# Patient Record
Sex: Male | Born: 1960 | Race: White | Hispanic: Yes | Marital: Married | Smoking: Former smoker
Health system: Southern US, Community
[De-identification: ages and names within clinical notes are randomized; demographics above are authoritative.]

## PROBLEM LIST (undated history)

## (undated) DIAGNOSIS — F419 Anxiety disorder, unspecified: Secondary | ICD-10-CM

## (undated) DIAGNOSIS — Z8619 Personal history of other infectious and parasitic diseases: Secondary | ICD-10-CM

## (undated) DIAGNOSIS — F329 Major depressive disorder, single episode, unspecified: Secondary | ICD-10-CM

## (undated) DIAGNOSIS — F32A Depression, unspecified: Secondary | ICD-10-CM

## (undated) DIAGNOSIS — Z87442 Personal history of urinary calculi: Secondary | ICD-10-CM

## (undated) DIAGNOSIS — I10 Essential (primary) hypertension: Secondary | ICD-10-CM

## (undated) DIAGNOSIS — G4733 Obstructive sleep apnea (adult) (pediatric): Secondary | ICD-10-CM

## (undated) DIAGNOSIS — N200 Calculus of kidney: Secondary | ICD-10-CM

## (undated) HISTORY — PX: CYSTOSCOPY W/ URETERAL STENT PLACEMENT: SHX1429

---

## 2006-12-17 ENCOUNTER — Emergency Department (HOSPITAL_COMMUNITY): Admission: EM | Admit: 2006-12-17 | Discharge: 2006-12-17 | Payer: Self-pay | Admitting: Emergency Medicine

## 2007-08-25 ENCOUNTER — Emergency Department (HOSPITAL_COMMUNITY): Admission: EM | Admit: 2007-08-25 | Discharge: 2007-08-25 | Payer: Self-pay | Admitting: Emergency Medicine

## 2008-10-22 ENCOUNTER — Emergency Department (HOSPITAL_COMMUNITY): Admission: EM | Admit: 2008-10-22 | Discharge: 2008-10-23 | Payer: Self-pay | Admitting: Emergency Medicine

## 2010-02-16 ENCOUNTER — Ambulatory Visit (HOSPITAL_BASED_OUTPATIENT_CLINIC_OR_DEPARTMENT_OTHER): Admission: RE | Admit: 2010-02-16 | Discharge: 2010-02-16 | Payer: Self-pay | Admitting: Family Medicine

## 2010-02-23 ENCOUNTER — Ambulatory Visit: Payer: Self-pay | Admitting: Internal Medicine

## 2010-10-14 ENCOUNTER — Emergency Department (HOSPITAL_COMMUNITY): Payer: Self-pay

## 2010-10-14 ENCOUNTER — Encounter (HOSPITAL_COMMUNITY): Payer: Self-pay | Admitting: Radiology

## 2010-10-14 ENCOUNTER — Emergency Department (HOSPITAL_COMMUNITY)
Admission: EM | Admit: 2010-10-14 | Discharge: 2010-10-14 | Disposition: A | Discharge status: Home / Self Care | Payer: Self-pay | Attending: Emergency Medicine | Admitting: Emergency Medicine

## 2010-10-14 DIAGNOSIS — J020 Streptococcal pharyngitis: Secondary | ICD-10-CM | POA: Insufficient documentation

## 2010-10-14 DIAGNOSIS — E860 Dehydration: Secondary | ICD-10-CM | POA: Insufficient documentation

## 2010-10-14 HISTORY — DX: Essential (primary) hypertension: I10

## 2010-10-14 LAB — DIFFERENTIAL
Basophils Absolute: 0 10*3/uL (ref 0.0–0.1)
Eosinophils Relative: 1 % (ref 0–5)
Lymphocytes Relative: 20 % (ref 12–46)
Lymphs Abs: 1.7 10*3/uL (ref 0.7–4.0)
Monocytes Absolute: 0.5 10*3/uL (ref 0.1–1.0)
Monocytes Relative: 6 % (ref 3–12)
Neutro Abs: 6.2 10*3/uL (ref 1.7–7.7)

## 2010-10-14 LAB — CBC
HCT: 47.2 % (ref 39.0–52.0)
Hemoglobin: 17.4 g/dL — ABNORMAL HIGH (ref 13.0–17.0)
MCH: 31.9 pg (ref 26.0–34.0)
MCHC: 36.9 g/dL — ABNORMAL HIGH (ref 30.0–36.0)
MCV: 86.4 fL (ref 78.0–100.0)
RDW: 12.5 % (ref 11.5–15.5)

## 2010-10-14 LAB — BASIC METABOLIC PANEL
BUN: 12 mg/dL (ref 6–23)
Calcium: 9.1 mg/dL (ref 8.4–10.5)
Creatinine, Ser: 0.82 mg/dL (ref 0.4–1.5)
GFR calc non Af Amer: >60 mL/min (ref >60)
Glucose, Bld: 118 mg/dL — ABNORMAL HIGH (ref 70–99)

## 2010-10-14 MED ORDER — IOHEXOL 300 MG/ML  SOLN
100.0000 mL | Freq: Once | INTRAMUSCULAR | Status: AC | PRN
  Administered 2010-10-14: 100 mL via INTRAVENOUS

## 2011-12-15 ENCOUNTER — Encounter (HOSPITAL_COMMUNITY): Payer: Self-pay | Admitting: Emergency Medicine

## 2011-12-15 ENCOUNTER — Emergency Department (HOSPITAL_COMMUNITY)
Admission: EM | Admit: 2011-12-15 | Discharge: 2011-12-16 | Disposition: A | Discharge status: Home / Self Care | Payer: Medicaid Other | Attending: Emergency Medicine | Admitting: Emergency Medicine

## 2011-12-15 DIAGNOSIS — R110 Nausea: Secondary | ICD-10-CM | POA: Insufficient documentation

## 2011-12-15 DIAGNOSIS — N1330 Unspecified hydronephrosis: Secondary | ICD-10-CM | POA: Insufficient documentation

## 2011-12-15 DIAGNOSIS — R350 Frequency of micturition: Secondary | ICD-10-CM | POA: Insufficient documentation

## 2011-12-15 DIAGNOSIS — R109 Unspecified abdominal pain: Secondary | ICD-10-CM | POA: Insufficient documentation

## 2011-12-15 DIAGNOSIS — Z79899 Other long term (current) drug therapy: Secondary | ICD-10-CM | POA: Insufficient documentation

## 2011-12-15 DIAGNOSIS — I10 Essential (primary) hypertension: Secondary | ICD-10-CM | POA: Insufficient documentation

## 2011-12-15 DIAGNOSIS — R3911 Hesitancy of micturition: Secondary | ICD-10-CM | POA: Insufficient documentation

## 2011-12-15 DIAGNOSIS — N201 Calculus of ureter: Secondary | ICD-10-CM

## 2011-12-15 LAB — URINE MICROSCOPIC-ADD ON

## 2011-12-15 LAB — URINALYSIS, ROUTINE W REFLEX MICROSCOPIC
Glucose, UA: NEGATIVE mg/dL
Ketones, ur: NEGATIVE mg/dL
Leukocytes, UA: NEGATIVE
Protein, ur: NEGATIVE mg/dL
pH: 6.5 (ref 5.0–8.0)

## 2011-12-15 NOTE — ED Notes (Signed)
Pt states he is having pain in his lft flank area  Pt states he feels like he needs to urinate but not much comes out  Pt states had the same about 3 days ago

## 2011-12-16 ENCOUNTER — Emergency Department (HOSPITAL_COMMUNITY): Payer: Medicaid Other

## 2011-12-16 LAB — CBC
MCH: 30.5 pg (ref 26.0–34.0)
MCV: 85.6 fL (ref 78.0–100.0)
Platelets: 255 10*3/uL (ref 150–400)
RBC: 5.41 MIL/uL (ref 4.22–5.81)
RDW: 12.4 % (ref 11.5–15.5)
WBC: 12.4 10*3/uL — ABNORMAL HIGH (ref 4.0–10.5)

## 2011-12-16 LAB — BASIC METABOLIC PANEL
Calcium: 9.4 mg/dL (ref 8.4–10.5)
Creatinine, Ser: 1.31 mg/dL (ref 0.50–1.35)
GFR calc Af Amer: 71 mL/min — ABNORMAL LOW (ref >90)
Sodium: 137 mEq/L (ref 135–145)

## 2011-12-16 MED ORDER — HYDROMORPHONE HCL PF 1 MG/ML IJ SOLN
1.0000 mg | Freq: Once | INTRAMUSCULAR | Status: AC
  Administered 2011-12-16: 1 mg via INTRAVENOUS
  Filled 2011-12-16: qty 1

## 2011-12-16 MED ORDER — ONDANSETRON HCL 4 MG/2ML IJ SOLN
4.0000 mg | Freq: Once | INTRAMUSCULAR | Status: AC
  Administered 2011-12-16: 4 mg via INTRAVENOUS
  Filled 2011-12-16: qty 2

## 2011-12-16 MED ORDER — KETOROLAC TROMETHAMINE 30 MG/ML IJ SOLN
30.0000 mg | Freq: Once | INTRAMUSCULAR | Status: AC
  Administered 2011-12-16: 30 mg via INTRAVENOUS
  Filled 2011-12-16: qty 1

## 2011-12-16 MED ORDER — PROMETHAZINE HCL 25 MG PO TABS: 25.0000 mg | ORAL_TABLET | Freq: Four times a day (QID) | ORAL | Status: DC | PRN

## 2011-12-16 MED ORDER — SODIUM CHLORIDE 0.9 % IV SOLN
Freq: Once | INTRAVENOUS | Status: AC
  Administered 2011-12-16: 01:00:00 via INTRAVENOUS

## 2011-12-16 MED ORDER — IBUPROFEN 600 MG PO TABS: 600.0000 mg | ORAL_TABLET | Freq: Three times a day (TID) | ORAL | Status: AC | PRN

## 2011-12-16 MED ORDER — OXYCODONE-ACETAMINOPHEN 5-325 MG PO TABS: 1.0000 | ORAL_TABLET | ORAL | Status: AC | PRN

## 2011-12-16 MED ORDER — TAMSULOSIN HCL 0.4 MG PO CAPS: 0.4000 mg | ORAL_CAPSULE | Freq: Every day | ORAL | Status: DC

## 2011-12-16 NOTE — Discharge Instructions (Signed)
Ureteral Colic (Kidney Stones) Ureteral colic is the result of a condition when kidney stones form inside the kidney. Once kidney stones are formed they may move into the tube that connects the kidney with the bladder (ureter). If this occurs, this condition may cause pain (colic) in the ureter.  CAUSES  Pain is caused by stone movement in the ureter and the obstruction caused by the stone. SYMPTOMS  The pain comes and goes as the ureter contracts around the stone. The pain is usually intense, sharp, and stabbing in character. The location of the pain may move as the stone moves through the ureter. When the stone is near the kidney the pain is usually located in the back and radiates to the belly (abdomen). When the stone is ready to pass into the bladder the pain is often located in the lower abdomen on the side the stone is located. At this location, the symptoms may mimic those of a urinary tract infection with urinary frequency. Once the stone is located here it often passes into the bladder and the pain disappears completely. TREATMENT   Your caregiver will provide you with medicine for pain relief.   You may require specialized follow-up X-rays.   The absence of pain does not always mean that the stone has passed. It may have just stopped moving. If the urine remains completely obstructed, it can cause loss of kidney function or even complete destruction of the involved kidney. It is your responsibility and in your interest that X-rays and follow-ups as suggested by your caregiver are completed. Relief of pain without passage of the stone can be associated with severe damage to the kidney, including loss of kidney function on that side.   If your stone does not pass on its own, additional measures may be taken by your caregiver to ensure its removal.  HOME CARE INSTRUCTIONS   Increase your fluid intake. Water is the preferred fluid since juices containing vitamin C may acidify the urine making  it less likely for certain stones (uric acid stones) to pass.   Strain all urine. A strainer will be provided. Keep all particulate matter or stones for your caregiver to inspect.   Take your pain medicine as directed.   Make a follow-up appointment with your caregiver as directed.   Remember that the goal is passage of your stone. The absence of pain does not mean the stone is gone. Follow your caregiver's instructions.   Only take over-the-counter or prescription medicines for pain, discomfort, or fever as directed by your caregiver.  SEEK MEDICAL CARE IF:   Pain cannot be controlled with the prescribed medicine.   You have a fever.   Pain continues for longer than your caregiver advises it should.   There is a change in the pain, and you develop chest discomfort or constant abdominal pain.   You feel faint or pass out.  MAKE SURE YOU:   Understand these instructions.   Will watch your condition.   Will get help right away if you are not doing well or get worse.  Document Released: 05/14/2005 Document Revised: 07/24/2011 Document Reviewed: 01/29/2011 [employer] Patient Information 2012 [employer]. 

## 2011-12-16 NOTE — ED Provider Notes (Signed)
History     CSN: [id number]  Arrival date & time 12/15/11  2212   First MD Initiated Contact with Patient 12/16/11 0003      Chief Complaint  Patient presents with  . Flank Pain    (Consider location/radiation/quality/duration/timing/severity/associated sxs/prior treatment) The history is provided by the patient.   the patient reports development of severe acute onset left flank pain with radiation to his left groin.  He denies dysuria.  He has had urinary frequency with some hesitation.  He denies fevers or chills.  He reports when the pain becomes severe he is very nauseated.  He has not vomited.  He denies abdominal pain at this time.  His had no diarrhea.  He has no history of ureteral stones.  Nothing worsens the symptoms.  Nothing improves his symptoms.  His symptoms are waxing and waning and severe in severity  Past Medical History  Diagnosis Date  . Hypertension   . Sleep apnea     History reviewed. No pertinent past surgical history.  History reviewed. No pertinent family history.  History  Substance Use Topics  . Smoking status: Former Games developer  . Smokeless tobacco: Not on file  . Alcohol Use: No      Review of Systems  All other systems reviewed and are negative.    Allergies  Penicillins  Home Medications   Current Outpatient Rx  Name Route Sig Dispense Refill  . GARLIC PO Oral Take 2 capsules by mouth.    . IBUPROFEN 200 MG PO TABS Oral Take 200 mg by mouth every 6 (six) hours as needed. Pain    . IBUPROFEN 600 MG PO TABS Oral Take 1 tablet (600 mg total) by mouth every 8 (eight) hours as needed for pain. 15 tablet 0  . OXYCODONE-ACETAMINOPHEN 5-325 MG PO TABS Oral Take 1 tablet by mouth every 4 (four) hours as needed for pain. 30 tablet 0  . PROMETHAZINE HCL 25 MG PO TABS Oral Take 1 tablet (25 mg total) by mouth every 6 (six) hours as needed for nausea. 12 tablet 0  . TAMSULOSIN HCL 0.4 MG PO CAPS Oral Take 1 capsule (0.4 mg total) by mouth daily. 7  capsule 0    BP 183/98  Pulse 87  Temp(Src) 98 F (36.7 C) (Oral)  Resp 16  Wt 191 lb 6.4 oz (86.818 kg)  SpO2 98%  Physical Exam  Nursing note and vitals reviewed. Constitutional: He is oriented to person, place, and time. He appears well-developed and well-nourished.       Uncomfortable appearing  HENT:  Head: Normocephalic and atraumatic.  Eyes: EOM are normal.  Neck: Normal range of motion.  Cardiovascular: Normal rate, regular rhythm, normal heart sounds and intact distal pulses.   Pulmonary/Chest: Effort normal and breath sounds normal. No respiratory distress.  Abdominal: Soft. He exhibits no distension. There is no tenderness.  Musculoskeletal: Normal range of motion.  Neurological: He is alert and oriented to person, place, and time.  Skin: Skin is warm and dry.  Psychiatric: He has a normal mood and affect. Judgment normal.    ED Course  Procedures (including critical care time)  Labs Reviewed  URINALYSIS, ROUTINE W REFLEX MICROSCOPIC - Abnormal; Notable for the following:    Hgb urine dipstick LARGE (*)    All other components within normal limits  URINE MICROSCOPIC-ADD ON - Abnormal; Notable for the following:    Crystals CA OXALATE CRYSTALS (*)    All other components within normal limits  CBC - Abnormal; Notable for the following:    WBC 12.4 (*)    All other components within normal limits  BASIC METABOLIC PANEL - Abnormal; Notable for the following:    Glucose, Bld 111 (*)    BUN 27 (*)    GFR calc non Af Amer 61 (*)    GFR calc Af Amer 71 (*)    All other components within normal limits  URINE CULTURE   Ct Abdomen Pelvis Wo Contrast  12/16/2011  *RADIOLOGY REPORT*  Clinical Data: Left flank pain  CT ABDOMEN AND PELVIS WITHOUT CONTRAST  Technique:  Multidetector CT imaging of the abdomen and pelvis was performed following the standard protocol without intravenous contrast.  Comparison: None.  Findings: Limited images through the lung bases demonstrate  no significant appreciable abnormality. The heart size is within normal limits. No pleural or pericardial effusion.  Intra-abdominal organ evaluation is limited without intravenous contrast.  Within this limitation, unremarkable liver, spleen, biliary system, pancreas, adrenal glands.  Nonobstructing interpolar right renal stone.  Nonspecific 1.1 cm hypoattenuation within the interpolar left kidney.  Within the lower pole, there is a nonobstructing left renal stone.  The left kidney is edematous with perinephric fat stranding.  There is mild hydroureteronephrosis to the level of a 4 mm stone at the UVJ.  Colonic diverticulosis without CT evidence for diverticulitis.  No bowel obstruction.  Normal appendix.  No free intraperitoneal air or fluid.  Circumferential bladder wall thickening is nonspecific even incomplete distension.  Mild prostatomegaly.  There is scattered atherosclerotic calcification of the aorta and its branches. No aneurysmal dilatation.  Multilevel degenerative changes of the imaged spine. No acute or aggressive appearing osseous lesion.  IMPRESSION: Left renal edema and perinephric fat stranding.  Mild hydroureteronephrosis to the level of a 4 mm UVJ stone. Superimposed infection not excluded.  Additional nonobstructing renal stones bilaterally.  Colonic diverticulosis.  Original Report Authenticated By: Waneta Martins, M.D.     1. Ureteral stone       MDM  The patient's symptoms are concerning for left ureteral stone.  The patient will undergo CT scan to further evaluate.  Pain and nausea treated at this time.  4:30 AM The patient feels much better at this time.  Discharge home in good condition.  Urology followup.  Standard outpatient medications.  It was explained to the patient to return to the emergency department for new or worsening symptoms including but not limited to worsening pain, severe nausea vomiting or development of high fever        Lyanne Co,  MD 12/16/11 0430

## 2011-12-17 LAB — URINE CULTURE

## 2012-11-24 ENCOUNTER — Encounter (HOSPITAL_COMMUNITY): Payer: Self-pay | Admitting: Emergency Medicine

## 2012-11-24 ENCOUNTER — Emergency Department (INDEPENDENT_AMBULATORY_CARE_PROVIDER_SITE_OTHER)
Admission: EM | Admit: 2012-11-24 | Discharge: 2012-11-24 | Disposition: A | Discharge status: Home / Self Care | Payer: Medicaid Other | Source: Home / Self Care

## 2012-11-24 DIAGNOSIS — R229 Localized swelling, mass and lump, unspecified: Secondary | ICD-10-CM

## 2012-11-24 DIAGNOSIS — L738 Other specified follicular disorders: Secondary | ICD-10-CM

## 2012-11-24 DIAGNOSIS — L739 Follicular disorder, unspecified: Secondary | ICD-10-CM

## 2012-11-24 MED ORDER — DOXYCYCLINE HYCLATE 100 MG PO CAPS: 100.0000 mg | ORAL_CAPSULE | Freq: Two times a day (BID) | ORAL | Status: DC

## 2012-11-24 NOTE — ED Notes (Signed)
Pt c/o rash on head onset 4 months Denies: itching, f/v/n/d Sx include: mild pain, drainage  Also c/o hard lumps in the abd area x2 years No pain associated w/lumps/cysts  He is alert and oriented w/no signs of acute distress.

## 2012-11-24 NOTE — ED Provider Notes (Signed)
Medical screening examination/treatment/procedure(s) were performed by resident physician or non-physician practitioner and as supervising physician I was immediately available for consultation/collaboration.   Elnathan Fulford DOUGLAS MD.   Amalio Loe D Amea Mcphail, MD 11/24/12 2033 

## 2012-11-24 NOTE — ED Provider Notes (Signed)
History     CSN: [id number]  Arrival date & time 11/24/12  1808   None     Chief Complaint  Patient presents with  . Rash    (Consider location/radiation/quality/duration/timing/severity/associated sxs/prior treatment) HPI Comments: 52 year old Arabic man presents with a complaint of "bumps on the head". He states they come and go he said these for over 2 years. They are tender and scattered throughout the scalp.  His second complaint is that of subcutaneous nodules for 2-4 years. They are not necessarily tender normally causing any apparent problem.   Past Medical History  Diagnosis Date  . Hypertension   . Sleep apnea     History reviewed. No pertinent past surgical history.  No family history on file.  History  Substance Use Topics  . Smoking status: Former Games developer  . Smokeless tobacco: Not on file  . Alcohol Use: No      Review of Systems  Constitutional: Negative.   HENT: Negative.   Respiratory: Negative.   Musculoskeletal: Negative.   Skin:       As per history of present illness  Neurological: Negative.     Allergies  Penicillins  Home Medications   Current Outpatient Rx  Name  Route  Sig  Dispense  Refill  . doxycycline (VIBRAMYCIN) 100 MG capsule   Oral   Take 1 capsule (100 mg total) by mouth 2 (two) times daily.   20 capsule   0   . GARLIC PO   Oral   Take 2 capsules by mouth.         Marland Kitchen ibuprofen (ADVIL,MOTRIN) 200 MG tablet   Oral   Take 200 mg by mouth every 6 (six) hours as needed. Pain         . EXPIRED: promethazine (PHENERGAN) 25 MG tablet   Oral   Take 1 tablet (25 mg total) by mouth every 6 (six) hours as needed for nausea.   12 tablet   0   . Tamsulosin HCl (FLOMAX) 0.4 MG CAPS   Oral   Take 1 capsule (0.4 mg total) by mouth daily.   7 capsule   0     BP 142/79  Pulse 86  Temp(Src) 98.1 F (36.7 C) (Oral)  Resp 18  SpO2 97%  Physical Exam  Nursing note and vitals reviewed. Constitutional: He appears  well-developed and well-nourished. No distress.  HENT:  Multiple follicular papules some red some flesh-colored and most are tender. No apparent drainage however a few are crusted over. No erythema.  Pulmonary/Chest: Effort normal.  Abdominal: Soft. There is no tenderness.  There are several 2 and half centimeter subcutaneous nodules throughout the abdomen. Many difficult to palpate other closed cervix. No discoloration, erythema or tenderness.  Neurological: He is alert. He exhibits normal muscle tone.  Skin: Skin is warm and dry. No erythema.  Follicular papules to the scalp as described above.    ED Course  Procedures (including critical care time)  Labs Reviewed - No data to display No results found.   1. Folliculitis   2. Subcutaneous nodules       MDM  Doxycycline 100 mg twice a day for 10 days. Call your doctor tomorrow for an appointment for evaluation of the subcutaneous nodules in the abdomen. This has been occurring for at least 2 years . I am not certain as to what the etiology of these nodules are however they do not appear to be an urgent problem.  Hayden Rasmussen, NP 11/24/12 [phone]

## 2014-02-24 ENCOUNTER — Encounter (HOSPITAL_COMMUNITY): Payer: Self-pay | Admitting: Emergency Medicine

## 2014-02-24 ENCOUNTER — Emergency Department (INDEPENDENT_AMBULATORY_CARE_PROVIDER_SITE_OTHER)
Admission: EM | Admit: 2014-02-24 | Discharge: 2014-02-24 | Disposition: A | Discharge status: Home / Self Care | Payer: Self-pay | Source: Home / Self Care | Attending: Emergency Medicine | Admitting: Emergency Medicine

## 2014-02-24 DIAGNOSIS — I10 Essential (primary) hypertension: Secondary | ICD-10-CM

## 2014-02-24 DIAGNOSIS — J069 Acute upper respiratory infection, unspecified: Secondary | ICD-10-CM

## 2014-02-24 MED ORDER — TRAMADOL HCL 50 MG PO TABS: ORAL_TABLET | ORAL | Status: DC

## 2014-02-24 MED ORDER — CARVEDILOL 6.25 MG PO TABS: 6.2500 mg | ORAL_TABLET | Freq: Two times a day (BID) | ORAL | Status: AC

## 2014-02-24 MED ORDER — TRIAMCINOLONE ACETONIDE 0.1 % EX CREA: 1.0000 "application " | TOPICAL_CREAM | Freq: Three times a day (TID) | CUTANEOUS | Status: DC

## 2014-02-24 MED ORDER — IPRATROPIUM BROMIDE 0.06 % NA SOLN: 2.0000 | Freq: Four times a day (QID) | NASAL | Status: DC

## 2014-02-24 MED ORDER — HYDROCHLOROTHIAZIDE 12.5 MG PO TABS: 12.5000 mg | ORAL_TABLET | Freq: Every day | ORAL | Status: DC

## 2014-02-24 NOTE — ED Notes (Signed)
Patient complains of sore throat Presents in office with elevated blood pressure

## 2014-02-24 NOTE — Discharge Instructions (Signed)
Most upper respiratory infections are caused by viruses and do not require antibiotics.  We try to save the antibiotics for when we really need them to prevent bacteria from developing resistance to them.  Here are a few hints about things that can be done at home to help get over an upper respiratory infection quicker: ° °Get extra sleep and extra fluids.  Get 7 to 9 hours of sleep per night and 6 to 8 glasses of water a day.  Getting extra sleep keeps the immune system from getting run down.  Most people with an upper respiratory infection are a little dehydrated.  The extra fluids also keep the secretions liquified and easier to deal with.  Also, get extra vitamin C.  4000 mg per day is the recommended dose. °For the aches, headache, and fever, acetaminophen or ibuprofen are helpful.  These can be alternated every 4 hours.  People with liver disease should avoid large amounts of acetaminophen, and people with ulcer disease, gastroesophageal reflux, gastritis, congestive heart failure, chronic kidney disease, coronary artery disease and the elderly should avoid ibuprofen. °For nasal congestion try Mucinex-D, or if you're having lots of sneezing or clear nasal drainage use Zyrtec-D. People with high blood pressure can take these if their blood pressure is controlled, if not, it's best to avoid the forms with a "D" (decongestants).  You can use the plain Mucinex, Allegra, Claritin, or Zyrtec even if your blood pressure is not controlled.   °A Saline nasal spray such as Ocean Spray can also help.  You can add a decongestant sprays such as Afrin, but you should not use the decongestant sprays for more than 3 or 4 days since they can be habituating.  Breathe Rite nasal strips can also offer a non-drug alternative treatment to nasal congestion, especially at night. °For people with symptoms of sinusitis, sleeping with your head elevated can be helpful.  For sinus pain, moist, hot compresses to the face may provide some  relief.  Many people find that inhaling steam as in a shower or from a pot of steaming water can help. °For any viral infection, zinc containing lozenges such as Cold-Eze or Zicam are helpful.  Zinc helps to fight viral infection.  Hot salt water gargles (8 oz of hot water, 1/2 tsp of table salt, and a pinch of baking soda) can give relief as well as hot beverages such as hot tea.  Sucrets extra strength lozenges will help the sore throat.  °For the cough, take Delsym 2 tsp every 12 hours.  It has also been found recently that Aleve can help control a cough.  The dose is 1 to 2 tablets twice daily with food.  This can be combined with Delsym. (Note, if you are taking ibuprofen, you should not take Aleve as well--take one or the other.) °A cool mist vaporizer will help keep your mucous membranes from drying out.  ° °It's important when you have an upper respiratory infection not to pass the infection to others.  This involves being very careful about the following: ° °Frequent hand washing or use of hand sanitizer, especially after coughing, sneezing, blowing your nose or touching your face, nose or eyes. °Do not shake hands or touch anyone and try to avoid touching surfaces that other people use such as doorknobs, shopping carts, telephones and computer keyboards. °Use tissues and dispose of them properly in a garbage can or ziplock bag. °Cough into your sleeve. °Do not let others eat or   drink after you. ° °It's also important to recognize the signs of serious illness and get evaluated if they occur: °Any respiratory infection that lasts more than 7 to 10 days.  Yellow nasal drainage and sputum are not reliable indicators of a bacterial infection, but if they last for more than 1 week, see your doctor. °Fever and sore throat can indicate strep. °Fever and cough can indicate influenza or pneumonia. °Any kind of severe symptom such as difficulty breathing, intractable vomiting, or severe pain should prompt you to see  a doctor as soon as possible. ° ° °Your body's immune system is really the thing that will get rid of this infection.  Your immune system is comprised of 2 types of specialized cells called T cells and B cells.  T cells coordinate the array of cells in your body that engulf invading bacteria or viruses while B cells orchestrate the production of antibodies that neutralize infection.  Anything we do or any medications we give you, will just strengthen your immune system or help it clear up the infection quicker.  Here are a few helpful hints to improve your immune system to help overcome this illness or to prevent future infections: °· A few vitamins can improve the health of your immune system.  That's why your diet should include plenty of fruits, vegetables, fish, nuts, and whole grains. °· Vitamin A and bet-carotene can increase the cells that fight infections (T cells and B cells).  Vitamin A is abundant in dark greens and orange vegetables such as spinach, greens, sweet potatoes, and carrots. °· Vitamin B6 contributes to the maturation of white blood cells, the cells that fight disease.  Foods with vitamin B6 include cold cereal and bananas. °· Vitamin C is credited with preventing colds because it increases white blood cells and also prevents cellular damage.  Citrus fruits, peaches and green and red bell peppers are all hight in vitamin C. °· Vitamin E is an anti-oxidant that encourages the production of natural killer cells which reject foreign invaders and B cells that produce antibodies.  Foods high in vitamin E include wheat germ, nuts and seeds. °· Foods high in omega-3 fatty acids found in foods like salmon, tuna and mackerel boost your immune system and help cells to engulf and absorb germs. °· Probiotics are good bacteria that increase your T cells.  These can be found in yogurt and are available in supplements such as Culturelle or Align. °· Moderate exercise increases the strength of your immune  system and your ability to recover from illness.  I suggest 3 to 5 moderate intensity 30 minute workouts per week.   °· Sleep is another component of maintaining a strong immune system.  It enables your body to recuperate from the day's activities, stress and work.  My recommendation is to get between 7 and 9 hours of sleep per night. °· If you smoke, try to quit completely or at least cut down.  Drink alcohol only in moderation if at all.  No more than 2 drinks daily for men or 1 for women. °· Get a flu vaccine early in the fall or if you have not gotten one yet, once this illness has run its course.  If you are over 65, a smoker, or an asthmatic, get a pneumococcal vaccine. °· My final recommendation is to maintain a healthy weight.  Excess weight can impair the immune system by interfering with the way the immune system deals with invading viruses or   bacteria. ° ° °Blood pressure over the ideal can put you at higher risk for stroke, heart disease, and kidney failure.  For this reason, it's important to try to get your blood pressure as close as possible to the ideal. ° °The ideal blood pressure is 120/80.  Blood pressures from 120-139 systolic over 80-89 diastolic are labeled as "prehypertension."  This means you are at higher risk of developing hypertension in the future.  Blood pressures in this range are not treated with medication, but lifestyle changes are recommended to prevent progression to hypertension.  Blood pressures of 140 and above systolic over 90 and above diastolic are classified as hypertension and are treated with medications. ° °Lifestyle changes which can benefit both prehypertension and hypertension include the following: ° °· Salt and sodium restriction. °· Weight loss. °· Regular exercise. °· Avoidance of tobacco. °· Avoidance of excess alcohol. °· The "D.A.S.H" diet. ° °· People with hypertension and prehypertension should limit their salt intake to less than 1500 mg daily.  Reading the  nutrition information on the label of many prepared foods can give you an idea of how much sodium you're consuming at each meal.  Remember that the most important number on the nutrition information is the serving size.  It may be smaller than you think.  Try to avoid adding extra salt at the table.  You may add small amounts of salt while cooking.  Remember that salt is an acquired taste and you may get used to a using a whole lot less salt than you are using now.  Using less salt lets the food's natural flavors come through.  You might want to consider using salt substitutes, potassium chloride, pepper, or blends of herbs and spices to enhance the flavor of your food.  Foods that contain the most salt include: processed meats (like ham, bacon, lunch meat, sausage, hot dogs, and breakfast meat), chips, pretzels, salted nuts, soups, salty snacks, canned foods, junk food, fast food, restaurant food, mustard, pickles, pizza, popcorn, soy sauce, and worcestershire sauce--quite a list!  You might ask, "Is there anything I can eat?"  The answer is, "yes."  Fruits and vegetables are usually low in salt.  Fresh is better than frozen which is better than canned.  If you have canned vegetables, you can cut down on the salt content by rinsing them in tap water 3 times before cooking.   ° °· Weight loss is the second thing you can do to lower your blood pressure.  Getting to and maintaining ideal weight will often normalize your blood pressure and allow you to avoid medications, entirely, cut way down on your dosage of medications, or allow to wean off your meds.  (Note, this should only be done under the supervision of your primary care doctor.)  Of course, weight loss takes time and you may need to be on medication in the meantime.  You shoot for a body mass index of 20-25.  When you go to the urgent care or to your primary care doctor, they should calculate your BMI.  If you don't know what it is, ask.  You can calculate  your BMI with the following formula:  Weight in pounds x 703/ (height in inches) x (height in inches).  There are many good diets out there: Weight Watchers and the D.A.S.H. Diet are the best, but often, just modifying a few factors can be helpful:  Don't skip meals, don't eat out, and keeping a food diary.    do not recommend fad diets or diet pills which often raise blood pressure.    Everyone should get regular exercise, but this is particularly important for people with high blood pressure.  Just about any exercise is good.  The only exercise which may be harmful is lifting extreme heavy weights.  I recommend moderate exercise such as walking for 30 minutes 5 days a week.  Going to the gym for a 50 minute workout 3 times a week is also good.  This amounts to 150 minutes of exercise weekly.   Anyone with high blood pressure should avoid any use of tobacco.  Tobacco use does not elevate blood pressure, but it increases the risk of heart disease and stroke.  If you are interested in quitting, discuss with your doctor how to quit.  If you are not interested in quitting, ask yourself, "What would my life be like in 10 years if I continue to smoke?"  "How will I know when it is time to quit?"  "How would my life be better if I were to quit."   Excess alcohol intake can raise the blood pressure.  The safe alcohol intake is 2 drinks or less per day for men and 1 drink per day or less for women.   There is a very good diet which I recommend that has been designed for people with blood pressure called the D.A.S.H. Diet (dietary approaches to stop hypertension).  It consists of fruits, vegetables, lean meats, low fat dairy, whole grains, nuts and seeds.  It is very low in salt and sodium.  It has also been found to have other beneficial health effects such as lowering cholesterol and helping lose weight.  It has been developed by the [employer] and can be downloaded from the internet without  any cost. Just do a Programmer, multimediaweb search on "D.A.S.H. Diet." or go the NIH website (GolfingPosters.tnwww.nih.gov).  There are also cookbooks and diet plans that can be gotten from [employer] to help you with this diet.

## 2014-02-24 NOTE — ED Provider Notes (Signed)
Chief Complaint   Chief Complaint  Patient presents with  . Sore Throat  . Hypertension    History of Present Illness   Eddie Miranda is a 53 year old male who's had a six-day history of sore throat, cough productive of white sputum, nasal congestion, headache, ear pressure, and diarrhea. His wife and son had the same illness. He denies fever, chills, nausea, vomiting, wheezing, or difficulty breathing.  He also has a history of high blood pressure and is on carvedilol 6.25 mg twice a day as well as another pill that sounds like hydrochlorothiazide. He denies any medication side effects, headaches, dizziness, chest pain, or shortness of breath.  Review of Systems   Other than as noted above, the patient denies any of the following symptoms: Systemic:  No fevers, chills, sweats, or myalgias. Eye:  No redness or discharge. ENT:  No ear pain, headache, nasal congestion, drainage, sinus pressure, or sore throat. Neck:  No neck pain, stiffness, or swollen glands. Lungs:  No cough, sputum production, hemoptysis, wheezing, chest tightness, shortness of breath or chest pain. GI:  No abdominal pain, nausea, vomiting or diarrhea.  PMFSH   Past medical history, family history, social history, meds, and allergies were reviewed.   Physical exam   Vital signs:  BP 174/122  Pulse 90  Temp(Src) 98.1 F (36.7 C) (Oral)  Resp 18  SpO2 97% General:  Alert and oriented.  In no distress.  Skin warm and dry. Eye:  No conjunctival injection or drainage. Lids were normal. ENT:  TMs and canals were normal, without erythema or inflammation.  Nasal mucosa was clear and uncongested, without drainage.  Mucous membranes were moist.  Pharynx was clear with no exudate or drainage.  There were no oral ulcerations or lesions. Neck:  Supple, no adenopathy, tenderness or mass. Lungs:  No respiratory distress.  Lungs were clear to auscultation, without wheezes, rales or rhonchi.  Breath sounds were clear and  equal bilaterally.  Heart:  Regular rhythm, without gallops, murmers or rubs. Skin:  Clear, warm, and dry, without rash or lesions.  Labs   Rapid strep antigen was negative.   Assessment     The primary encounter diagnosis was Viral URI. A diagnosis of Essential hypertension was also pertinent to this visit.  Suggested since then the treatment of the viral illness and getting back on blood pressure meds. Followup with community health and wellness as soon as possible for the blood pressure.  Plan    1.  Meds:  The following meds were prescribed:   Discharge Medication List as of 02/24/2014  6:40 PM    START taking these medications   Details  carvedilol (COREG) 6.25 MG tablet Take 1 tablet (6.25 mg total) by mouth 2 (two) times daily with a meal., Starting 02/24/2014, Until Discontinued, Normal    hydrochlorothiazide (HYDRODIURIL) 12.5 MG tablet Take 1 tablet (12.5 mg total) by mouth daily., Starting 02/24/2014, Until Discontinued, Normal    ipratropium (ATROVENT) 0.06 % nasal spray Place 2 sprays into both nostrils 4 (four) times daily., Starting 02/24/2014, Until Discontinued, Normal    traMADol (ULTRAM) 50 MG tablet 1 to 2 tablets every 8 hours as needed for cough, Print    triamcinolone cream (KENALOG) 0.1 % Apply 1 application topically 3 (three) times daily., Starting 02/24/2014, Until Discontinued, Normal        2.  Patient Education/Counseling:  The patient was given appropriate handouts, self care instructions, and instructed in symptomatic relief.  Instructed to get extra fluids,  rest, and use a cool mist vaporizer.    3.  Follow up:  The patient was told to follow up here if no better in 3 to 4 days, or sooner if becoming worse in any way, and given some red flag symptoms such as increasing fever, difficulty breathing, chest pain, or persistent vomiting which would prompt immediate return.  Follow up here as needed.      Reuben Likes, MD 02/24/14 2219

## 2014-02-26 LAB — CULTURE, GROUP A STREP

## 2014-03-20 ENCOUNTER — Other Ambulatory Visit (HOSPITAL_COMMUNITY): Payer: Self-pay | Admitting: Respiratory Therapy

## 2014-03-20 DIAGNOSIS — J42 Unspecified chronic bronchitis: Secondary | ICD-10-CM

## 2014-03-22 ENCOUNTER — Ambulatory Visit (HOSPITAL_COMMUNITY)
Admission: RE | Admit: 2014-03-22 | Discharge: 2014-03-22 | Disposition: A | Discharge status: Home / Self Care | Payer: Medicaid Other | Source: Ambulatory Visit | Attending: Family Medicine | Admitting: Family Medicine

## 2014-03-22 DIAGNOSIS — J42 Unspecified chronic bronchitis: Secondary | ICD-10-CM | POA: Insufficient documentation

## 2014-03-22 MED ORDER — ALBUTEROL SULFATE (2.5 MG/3ML) 0.083% IN NEBU
2.5000 mg | INHALATION_SOLUTION | Freq: Once | RESPIRATORY_TRACT | Status: AC
  Administered 2014-03-22: 2.5 mg via RESPIRATORY_TRACT

## 2014-03-23 LAB — PULMONARY FUNCTION TEST
DL/VA % PRED: 122 %
DL/VA: 5.46 ml/min/mmHg/L
DLCO cor % pred: 132 %
DLCO cor: 37.5 ml/min/mmHg
DLCO unc % pred: 132 %
DLCO unc: 37.5 ml/min/mmHg
FEF 25-75 POST: 3.31 L/s
FEF 25-75 Pre: 2.99 L/sec
FEF2575-%Change-Post: 10 %
FEF2575-%PRED-POST: 110 %
FEF2575-%Pred-Pre: 100 %
FEV1-%Change-Post: 2 %
FEV1-%PRED-POST: 109 %
FEV1-%Pred-Pre: 106 %
FEV1-Post: 3.75 L
FEV1-Pre: 3.66 L
FEV1FVC-%CHANGE-POST: 1 %
FEV1FVC-%PRED-PRE: 97 %
FEV6-%Change-Post: 0 %
FEV6-%PRED-PRE: 114 %
FEV6-%Pred-Post: 114 %
FEV6-Post: 4.87 L
FEV6-Pre: 4.86 L
FEV6FVC-%Change-Post: 0 %
FEV6FVC-%Pred-Post: 103 %
FEV6FVC-%Pred-Pre: 104 %
FVC-%CHANGE-POST: 0 %
FVC-%Pred-Post: 110 %
FVC-%Pred-Pre: 109 %
FVC-PRE: 4.87 L
FVC-Post: 4.9 L
PRE FEV1/FVC RATIO: 75 %
Post FEV1/FVC ratio: 76 %
Post FEV6/FVC ratio: 100 %
Pre FEV6/FVC Ratio: 100 %
RV % pred: 65 %
RV: 1.28 L
TLC % pred: 100 %
TLC: 6.42 L

## 2014-03-26 ENCOUNTER — Ambulatory Visit (HOSPITAL_BASED_OUTPATIENT_CLINIC_OR_DEPARTMENT_OTHER): Payer: Medicaid Other | Attending: Internal Medicine

## 2014-03-26 VITALS — Ht 67.0 in | Wt 214.0 lb

## 2014-03-26 DIAGNOSIS — G4710 Hypersomnia, unspecified: Secondary | ICD-10-CM | POA: Diagnosis present

## 2014-03-26 DIAGNOSIS — G4733 Obstructive sleep apnea (adult) (pediatric): Secondary | ICD-10-CM | POA: Insufficient documentation

## 2014-03-26 DIAGNOSIS — G4730 Sleep apnea, unspecified: Secondary | ICD-10-CM | POA: Diagnosis present

## 2014-03-29 ENCOUNTER — Inpatient Hospital Stay (HOSPITAL_COMMUNITY): Admission: RE | Admit: 2014-03-29 | Payer: Medicaid Other | Source: Ambulatory Visit

## 2014-04-01 DIAGNOSIS — G4733 Obstructive sleep apnea (adult) (pediatric): Secondary | ICD-10-CM

## 2014-04-01 NOTE — Sleep Study (Signed)
   NAME: Eddie Miranda DATE OF BIRTH:  June 28, 1961 MEDICAL RECORD NUMBER [id number]  LOCATION: [facility]  PHYSICIAN: [name]  DATE OF STUDY: 03/26/2014  SLEEP STUDY TYPE: Nocturnal Polysomnogram               REFERRING PHYSICIAN: Renaye RakersBland, Veita, MD  INDICATION FOR STUDY: Hypersomnia with sleep apnea  EPWORTH SLEEPINESS SCORE:   14/24 HEIGHT: 5\' 7"  (170.2 cm)  WEIGHT: 214 lb (97.07 kg)    Body mass index is 33.51 kg/(m^2).  NECK SIZE: 17 in.  MEDICATIONS: Charted for review  SLEEP ARCHITECTURE: Total sleep time 231.5 minutes with sleep efficiency 62.7%. Stage I was 23.1%, stage II 59.8%, stage III 0.2%, REM 16.8% of total sleep time. Sleep latency 47 minutes, REM latency 111 minutes, awake after sleep onset 90.5 minutes, arousal index 31.1, bedtime medication: None  RESPIRATORY DATA: Apnea hypopneas index (AHI) 64.3 per hour. 248 events scored including 45 obstructive apneas, 3 central apneas, 47 mixed apneas, 153 hypopneas. Most events were none supine. REM AHI 53.8 per hour. He was unable to sustain sleep adequately to meet protocol requirements for split CPAP titration on this study.  OXYGEN DATA: Moderate to very loud snoring with oxygen desaturation to a nadir of 77% and mean saturation 91.4% on room air  CARDIAC DATA: Sinus rhythm with PACs  MOVEMENT/PARASOMNIA: No significant movement disturbance, bathroom x2  IMPRESSION/ RECOMMENDATION:   1) Severe obstructive sleep apnea/hypopneas syndrome, AHI 64.3 per hour with mostly nonsupine events. REM AHI 53.8 per hour. Moderate to very loud snoring with oxygen desaturation to a nadir of 77% and mean saturation 91.4% on room air. 2) He had difficulty sustaining sleep in the first hours of the study and did not meet protocol requirements to permit split CPAP titration on the study night. He can return for a dedicated CPAP titration study if appropriate. 3) A previous polysomnogram on 02/16/2010 record AHI  74.8 per hour with CPAP titration to 12 CWP, body weight 217 pounds.  Eddie Miranda,Eddie Miranda Diplomate, American Board of Sleep Medicine  ELECTRONICALLY SIGNED ON:  04/01/2014, 11:39 AM [facility] PH: [phone]   FX: [phone] ACCREDITED BY THE AMERICAN ACADEMY OF SLEEP MEDICINE

## 2014-05-23 ENCOUNTER — Encounter (HOSPITAL_COMMUNITY): Payer: Self-pay | Admitting: Emergency Medicine

## 2014-05-23 ENCOUNTER — Emergency Department (HOSPITAL_COMMUNITY)
Admission: EM | Admit: 2014-05-23 | Discharge: 2014-05-23 | Disposition: A | Discharge status: Home / Self Care | Payer: Medicaid Other | Attending: Emergency Medicine | Admitting: Emergency Medicine

## 2014-05-23 DIAGNOSIS — Z7952 Long term (current) use of systemic steroids: Secondary | ICD-10-CM | POA: Diagnosis not present

## 2014-05-23 DIAGNOSIS — Z792 Long term (current) use of antibiotics: Secondary | ICD-10-CM | POA: Diagnosis not present

## 2014-05-23 DIAGNOSIS — Z79899 Other long term (current) drug therapy: Secondary | ICD-10-CM | POA: Insufficient documentation

## 2014-05-23 DIAGNOSIS — Z880 Allergy status to penicillin: Secondary | ICD-10-CM | POA: Insufficient documentation

## 2014-05-23 DIAGNOSIS — Z87891 Personal history of nicotine dependence: Secondary | ICD-10-CM | POA: Insufficient documentation

## 2014-05-23 DIAGNOSIS — H6091 Unspecified otitis externa, right ear: Secondary | ICD-10-CM

## 2014-05-23 DIAGNOSIS — I10 Essential (primary) hypertension: Secondary | ICD-10-CM | POA: Diagnosis not present

## 2014-05-23 DIAGNOSIS — H9201 Otalgia, right ear: Secondary | ICD-10-CM | POA: Diagnosis present

## 2014-05-23 DIAGNOSIS — H60391 Other infective otitis externa, right ear: Secondary | ICD-10-CM | POA: Diagnosis not present

## 2014-05-23 DIAGNOSIS — H6591 Unspecified nonsuppurative otitis media, right ear: Secondary | ICD-10-CM

## 2014-05-23 MED ORDER — CIPROFLOXACIN-DEXAMETHASONE 0.3-0.1 % OT SUSP: 4.0000 [drp] | Freq: Two times a day (BID) | OTIC | Status: DC

## 2014-05-23 MED ORDER — CEFDINIR 300 MG PO CAPS: 300.0000 mg | ORAL_CAPSULE | Freq: Two times a day (BID) | ORAL | Status: DC

## 2014-05-23 NOTE — ED Provider Notes (Signed)
CSN: [id number]     Arrival date & time 05/23/14  1746 History  This chart was scribed for Eddie SitesLisa Jaclin Finks, PA-C working with Samuel JesterKathleen McManus, DO by Evon Slackerrance Branch, ED Scribe. This patient was seen in room TR05C/TR05C and the patient's care was started at 7:08 PM.     Chief Complaint  Patient presents with  . Otalgia    Patient is a 53 y.o. male presenting with ear pain. The history is provided by the patient. No language interpreter was used.  Otalgia Associated symptoms: no fever    HPI Comments: Eddie Miranda is a 53 y.o. male who presents to the Emergency Department complaining of rigth sided otalgia onset 3 days prior.  Patient states his hearing in his right ear also sounds "muffled" but he is able to hear.  Denies fever, chills, sweats, drainage from ear.  No trauma to ear.  Hx of same with prior ear infections. No intervention tried PTA.   Past Medical History  Diagnosis Date  . Hypertension   . Sleep apnea    History reviewed. No pertinent past surgical history. No family history on file. History  Substance Use Topics  . Smoking status: Former Games developermoker  . Smokeless tobacco: Not on file  . Alcohol Use: No    Review of Systems  Constitutional: Negative for fever.  HENT: Positive for ear pain.   All other systems reviewed and are negative.   Allergies  Penicillins  Home Medications   Prior to Admission medications   Medication Sig Start Date End Date Taking? Authorizing Provider  carvedilol (COREG) 6.25 MG tablet Take 1 tablet (6.25 mg total) by mouth 2 (two) times daily with a meal. 02/24/14   [name], MD  doxycycline (VIBRAMYCIN) 100 MG capsule Take 1 capsule (100 mg total) by mouth 2 (two) times daily. 11/24/12   Hayden Rasmussenavid Mabe, NP  GARLIC PO Take 2 capsules by mouth.    Historical Provider, MD  hydrochlorothiazide (HYDRODIURIL) 12.5 MG tablet Take 1 tablet (12.5 mg total) by mouth daily. 02/24/14   [name], MD  ibuprofen (ADVIL,MOTRIN) 200 MG tablet Take  200 mg by mouth every 6 (six) hours as needed. Pain    Historical Provider, MD  ipratropium (ATROVENT) 0.06 % nasal spray Place 2 sprays into both nostrils 4 (four) times daily. 02/24/14   [name], MD  promethazine (PHENERGAN) 25 MG tablet Take 1 tablet (25 mg total) by mouth every 6 (six) hours as needed for nausea. 12/16/11 12/23/11  [name], MD  Tamsulosin HCl (FLOMAX) 0.4 MG CAPS Take 1 capsule (0.4 mg total) by mouth daily. 12/16/11   [name], MD  traMADol [name](ULTRAM) 50 MG tablet 1 to 2 tablets every 8 hours as needed for cough 02/24/14   [name], MD  triamcinolone cream (KENALOG) 0.1 % Apply 1 application topically 3 (three) times daily. 02/24/14   [name], MD   Triage Vitals: BP 155/95  Pulse 77  Temp(Src) 97.9 F (36.6 C) (Oral)  Resp 18  Ht 5\' 5"  (1.651 m)  Wt 212 lb (96.163 kg)  BMI 35.28 kg/m2  SpO2 96%  Physical Exam  Nursing note and vitals reviewed. Constitutional: He is oriented to person, place, and time. He appears well-developed and well-nourished. No distress.  HENT:  Head: Normocephalic and atraumatic.  Mouth/Throat: Oropharynx is clear and moist.  Right EAC swollen and erythematous; TM intact with middle ear effusion present; no pain with ear movement; no mastoid tenderness;  no facial or neck swelling; hearing decreased when compared with left ear but remains intact (states hearing is muffled) Left ear WNL  Eyes: Conjunctivae and EOM are normal. Pupils are equal, round, and reactive to light.  Neck: Normal range of motion. Neck supple.  Cardiovascular: Normal rate, regular rhythm and normal heart sounds.   Pulmonary/Chest: Effort normal and breath sounds normal. No respiratory distress. He has no wheezes.  Musculoskeletal: Normal range of motion.  Neurological: He is alert and oriented to person, place, and time.  Skin: Skin is warm and dry. He is not diaphoretic.  Psychiatric: He has a normal mood and affect.    ED Course  Procedures  (including critical care time) DIAGNOSTIC STUDIES: Oxygen Saturation is 96% on RA, normal by my interpretation.    COORDINATION OF CARE: 7:11 PM-Discussed treatment plan which includes abx and medicated drops with pt at bedside and pt agreed to plan.   Labs Review Labs Reviewed - No data to display  Imaging Review No results found.   EKG Interpretation None      MDM   Final diagnoses:  Right otitis externa  Middle ear effusion, right   Right ear pain and muffled hearing for the past 3 days. No known head/ear injury or trauma. On exam, right EAC appears erythematous and swollen, as well as a noted middle ear effusion. TM is intact. Patient was started on Ciprodex and Omnicef as he has a penicillin allergy. Recommended close followup with ENT if symptoms persist.  Discussed plan with patient, he/she acknowledged understanding and agreed with plan of care.  Return precautions given for new or worsening symptoms.  I personally performed the services described in this documentation, which was scribed in my presence. The recorded information has been reviewed and is accurate.  [name], PA-C 05/23/14 2053

## 2014-05-23 NOTE — ED Notes (Signed)
Pt states he has been having right ear pain for the last few days and can't hear his wife very well.

## 2014-05-23 NOTE — Discharge Instructions (Signed)
Take the prescribed medication as directed. Follow-up with Dr. Jenne PaneBates (ENT) if symptoms worsen or no improvement in the next few days. Return to the ED for new or worsening symptoms.

## 2014-05-24 NOTE — ED Provider Notes (Signed)
Medical screening examination/treatment/procedure(s) were performed by non-physician practitioner and as supervising physician I was immediately available for consultation/collaboration.   EKG Interpretation None        Clemie General, DO 05/24/14 1619 

## 2014-10-30 ENCOUNTER — Encounter (HOSPITAL_COMMUNITY): Payer: Self-pay | Admitting: Emergency Medicine

## 2014-10-30 ENCOUNTER — Emergency Department (HOSPITAL_COMMUNITY)
Admission: EM | Admit: 2014-10-30 | Discharge: 2014-10-30 | Disposition: A | Discharge status: Home / Self Care | Payer: Medicaid Other | Attending: Emergency Medicine | Admitting: Emergency Medicine

## 2014-10-30 DIAGNOSIS — Z79899 Other long term (current) drug therapy: Secondary | ICD-10-CM | POA: Insufficient documentation

## 2014-10-30 DIAGNOSIS — M5489 Other dorsalgia: Secondary | ICD-10-CM

## 2014-10-30 DIAGNOSIS — Z792 Long term (current) use of antibiotics: Secondary | ICD-10-CM | POA: Diagnosis not present

## 2014-10-30 DIAGNOSIS — I10 Essential (primary) hypertension: Secondary | ICD-10-CM | POA: Diagnosis not present

## 2014-10-30 DIAGNOSIS — Z87442 Personal history of urinary calculi: Secondary | ICD-10-CM | POA: Insufficient documentation

## 2014-10-30 DIAGNOSIS — Z8669 Personal history of other diseases of the nervous system and sense organs: Secondary | ICD-10-CM | POA: Diagnosis not present

## 2014-10-30 DIAGNOSIS — Z7951 Long term (current) use of inhaled steroids: Secondary | ICD-10-CM | POA: Insufficient documentation

## 2014-10-30 DIAGNOSIS — M546 Pain in thoracic spine: Secondary | ICD-10-CM | POA: Diagnosis present

## 2014-10-30 DIAGNOSIS — Z87891 Personal history of nicotine dependence: Secondary | ICD-10-CM | POA: Insufficient documentation

## 2014-10-30 DIAGNOSIS — Z880 Allergy status to penicillin: Secondary | ICD-10-CM | POA: Diagnosis not present

## 2014-10-30 LAB — URINALYSIS, ROUTINE W REFLEX MICROSCOPIC
Bilirubin Urine: NEGATIVE
GLUCOSE, UA: NEGATIVE mg/dL
Ketones, ur: NEGATIVE mg/dL
Leukocytes, UA: NEGATIVE
Nitrite: NEGATIVE
PH: 7 (ref 5.0–8.0)
Protein, ur: NEGATIVE mg/dL
SPECIFIC GRAVITY, URINE: 1.018 (ref 1.005–1.030)
Urobilinogen, UA: 1 mg/dL (ref 0.0–1.0)

## 2014-10-30 LAB — URINE MICROSCOPIC-ADD ON

## 2014-10-30 MED ORDER — IBUPROFEN 800 MG PO TABS
800.0000 mg | ORAL_TABLET | Freq: Once | ORAL | Status: AC
  Administered 2014-10-30: 800 mg via ORAL
  Filled 2014-10-30: qty 1

## 2014-10-30 MED ORDER — METHOCARBAMOL 750 MG PO TABS: 750.0000 mg | ORAL_TABLET | Freq: Four times a day (QID) | ORAL | Status: DC

## 2014-10-30 NOTE — Discharge Instructions (Signed)
Back Pain, Adult Low back pain is very common. About 1 in 5 people have back pain.The cause of low back pain is rarely dangerous. The pain often gets better over time.About half of people with a sudden onset of back pain feel better in just 2 weeks. About 8 in 10 people feel better by 6 weeks.  CAUSES Some common causes of back pain include:  Strain of the muscles or ligaments supporting the spine.  Wear and tear (degeneration) of the spinal discs.  Arthritis.  Direct injury to the back. DIAGNOSIS Most of the time, the direct cause of low back pain is not known.However, back pain can be treated effectively even when the exact cause of the pain is unknown.Answering your caregiver's questions about your overall health and symptoms is one of the most accurate ways to make sure the cause of your pain is not dangerous. If your caregiver needs more information, he or she may order lab work or imaging tests (X-rays or MRIs).However, even if imaging tests show changes in your back, this usually does not require surgery. HOME CARE INSTRUCTIONS For many people, back pain returns.Since low back pain is rarely dangerous, it is often a condition that people can learn to manageon their own.   Remain active. It is stressful on the back to sit or stand in one place. Do not sit, drive, or stand in one place for more than 30 minutes at a time. Take short walks on level surfaces as soon as pain allows.Try to increase the length of time you walk each day.  Do not stay in bed.Resting more than 1 or 2 days can delay your recovery.  Do not avoid exercise or work.Your body is made to move.It is not dangerous to be active, even though your back may hurt.Your back will likely heal faster if you return to being active before your pain is gone.  Pay attention to your body when you bend and lift. Many people have less discomfortwhen lifting if they bend their knees, keep the load close to their bodies,and  avoid twisting. Often, the most comfortable positions are those that put less stress on your recovering back.  Find a comfortable position to sleep. Use a firm mattress and lie on your side with your knees slightly bent. If you lie on your back, put a pillow under your knees.  Only take over-the-counter or prescription medicines as directed by your caregiver. Over-the-counter medicines to reduce pain and inflammation are often the most helpful.Your caregiver may prescribe muscle relaxant drugs.These medicines help dull your pain so you can more quickly return to your normal activities and healthy exercise.  Put ice on the injured area.  Put ice in a plastic bag.  Place a towel between your skin and the bag.  Leave the ice on for 15-20 minutes, 03-04 times a day for the first 2 to 3 days. After that, ice and heat may be alternated to reduce pain and spasms.  Ask your caregiver about trying back exercises and gentle massage. This may be of some benefit.  Avoid feeling anxious or stressed.Stress increases muscle tension and can worsen back pain.It is important to recognize when you are anxious or stressed and learn ways to manage it.Exercise is a great option. SEEK MEDICAL CARE IF:  You have pain that is not relieved with rest or medicine.  You have pain that does not improve in 1 week.  You have new symptoms.  You are generally not feeling well. SEEK   IMMEDIATE MEDICAL CARE IF:   You have pain that radiates from your back into your legs.  You develop new bowel or bladder control problems.  You have unusual weakness or numbness in your arms or legs.  You develop nausea or vomiting.  You develop abdominal pain.  You feel faint. Document Released: 08/04/2005 Document Revised: 02/03/2012 Document Reviewed: 12/06/2013 Exit[employer] Patient Information 2015 [employer]. This information is not intended to replace advice given to you by your health care provider. Make sure you  discuss any questions you have with your health care provider.  

## 2014-10-30 NOTE — ED Notes (Signed)
Patient presents with right back pain at the top of the flank area and just inside. He states that he passed a kidney stone about three days ago and later that day the pain began in his back.

## 2014-10-30 NOTE — ED Notes (Signed)
Given urinal for sample

## 2014-10-30 NOTE — ED Provider Notes (Signed)
CSN: [id number]     Arrival date & time 10/30/14  0508 History   First MD Initiated Contact with Patient 10/30/14 0522     Chief Complaint  Patient presents with  . Back Pain     (Consider location/radiation/quality/duration/timing/severity/associated sxs/prior Treatment) HPI Comments: Patient here with right upper back pain 3 days. Pain is constant and worse with certain positions. States he did pass a kidney stone 3 years ago and denies any hematuria or dysuria. No fever or chills. No rashes to the area. Pain is better without moving. No radiation to his leg. No fever, vomiting, diarrhea. No treatment use prior to arrival  Patient is a 54 y.o. male presenting with back pain. The history is provided by the patient.  Back Pain   Past Medical History  Diagnosis Date  . Hypertension   . Sleep apnea   . Kidney stones    History reviewed. No pertinent past surgical history. History reviewed. No pertinent family history. History  Substance Use Topics  . Smoking status: Former Games developermoker  . Smokeless tobacco: Not on file  . Alcohol Use: No    Review of Systems  Musculoskeletal: Positive for back pain.  All other systems reviewed and are negative.     Allergies  Penicillins  Home Medications   Prior to Admission medications   Medication Sig Start Date End Date Taking? Authorizing Provider  carvedilol (COREG) 6.25 MG tablet Take 1 tablet (6.25 mg total) by mouth 2 (two) times daily with a meal. 02/24/14   [name], MD  cefdinir (OMNICEF) 300 MG capsule Take 1 capsule (300 mg total) by mouth 2 (two) times daily. 05/23/14   Garlon HatchetLisa M Sanders, PA-C  ciprofloxacin-dexamethasone (CIPRODEX) otic suspension Place 4 drops into the right ear 2 (two) times daily. 05/23/14   Garlon HatchetLisa M Sanders, PA-C  doxycycline (VIBRAMYCIN) 100 MG capsule Take 1 capsule (100 mg total) by mouth 2 (two) times daily. 11/24/12   Hayden Rasmussenavid Mabe, NP  GARLIC PO Take 2 capsules by mouth.    Historical Provider, MD    hydrochlorothiazide (HYDRODIURIL) 12.5 MG tablet Take 1 tablet (12.5 mg total) by mouth daily. 02/24/14   [name], MD  ibuprofen (ADVIL,MOTRIN) 200 MG tablet Take 200 mg by mouth every 6 (six) hours as needed. Pain    Historical Provider, MD  ipratropium (ATROVENT) 0.06 % nasal spray Place 2 sprays into both nostrils 4 (four) times daily. 02/24/14   [name], MD  promethazine (PHENERGAN) 25 MG tablet Take 1 tablet (25 mg total) by mouth every 6 (six) hours as needed for nausea. 12/16/11 12/23/11  Azalia BilisKevin Campos, MD  Tamsulosin HCl (FLOMAX) 0.4 MG CAPS Take 1 capsule (0.4 mg total) by mouth daily. 12/16/11   Azalia BilisKevin Campos, MD  traMADol [name](ULTRAM) 50 MG tablet 1 to 2 tablets every 8 hours as needed for cough 02/24/14   [name], MD  triamcinolone cream (KENALOG) 0.1 % Apply 1 application topically 3 (three) times daily. 02/24/14   [name], MD   BP 164/97 mmHg  Pulse 73  Temp(Src) 97.8 F (36.6 C) (Oral)  Resp 17  Ht 5\' 6"  (1.676 m)  Wt 210 lb (95.255 kg)  BMI 33.91 kg/m2  SpO2 96% Physical Exam  Constitutional: He is oriented to person, place, and time. He appears well-developed and well-nourished.  Non-toxic appearance. No distress.  HENT:  Head: Normocephalic and atraumatic.  Eyes: Conjunctivae, EOM and lids are normal. Pupils are equal, round, and reactive to  light.  Neck: Normal range of motion. Neck supple. No tracheal deviation present. No thyroid mass present.  Cardiovascular: Normal rate, regular rhythm and normal heart sounds.  Exam reveals no gallop.   No murmur heard. Pulmonary/Chest: Effort normal and breath sounds normal. No stridor. No respiratory distress. He has no decreased breath sounds. He has no wheezes. He has no rhonchi. He has no rales.  Abdominal: Soft. Normal appearance and bowel sounds are normal. He exhibits no distension. There is no tenderness. There is no rebound and no CVA tenderness.  Musculoskeletal: Normal range of motion. He exhibits no edema  or tenderness.       Back:  Neurological: He is alert and oriented to person, place, and time. He has normal strength. No cranial nerve deficit or sensory deficit. GCS eye subscore is 4. GCS verbal subscore is 5. GCS motor subscore is 6.  Skin: Skin is warm and dry. No abrasion and no rash noted.  Psychiatric: He has a normal mood and affect. His speech is normal and behavior is normal.  Nursing note and vitals reviewed.   ED Course  Procedures (including critical care time) Labs Review Labs Reviewed  URINALYSIS, ROUTINE W REFLEX MICROSCOPIC    Imaging Review No results found.   EKG Interpretation None      MDM   Final diagnoses:  None    Pt with likely msk pain, urine results noted, pt offered motrin and deferred, stable for d/c    Lorre Nick, MD 10/30/14 [phone]

## 2014-10-30 NOTE — ED Notes (Signed)
MD at bedside. 

## 2014-10-30 NOTE — ED Notes (Signed)
Patient originally refused the pain medication prescribed during visit and requested this at discharge. Discharge instructions reviewed and teach back method utilized.

## 2015-03-16 ENCOUNTER — Other Ambulatory Visit (HOSPITAL_COMMUNITY)
Admission: RE | Admit: 2015-03-16 | Discharge: 2015-03-16 | Disposition: A | Discharge status: Home / Self Care | Payer: Medicaid Other | Source: Ambulatory Visit | Attending: Family Medicine | Admitting: Family Medicine

## 2015-03-16 DIAGNOSIS — Z113 Encounter for screening for infections with a predominantly sexual mode of transmission: Secondary | ICD-10-CM | POA: Diagnosis not present

## 2015-03-20 ENCOUNTER — Other Ambulatory Visit (HOSPITAL_COMMUNITY): Payer: Self-pay | Admitting: Urology

## 2015-03-20 ENCOUNTER — Other Ambulatory Visit: Payer: Self-pay | Admitting: Urology

## 2015-03-20 DIAGNOSIS — N200 Calculus of kidney: Secondary | ICD-10-CM

## 2015-03-31 ENCOUNTER — Encounter (HOSPITAL_COMMUNITY): Payer: Self-pay | Admitting: Emergency Medicine

## 2015-03-31 ENCOUNTER — Emergency Department (HOSPITAL_COMMUNITY): Payer: Medicaid Other

## 2015-03-31 ENCOUNTER — Emergency Department (HOSPITAL_COMMUNITY)
Admission: EM | Admit: 2015-03-31 | Discharge: 2015-03-31 | Disposition: A | Discharge status: Home / Self Care | Payer: Medicaid Other | Attending: Emergency Medicine | Admitting: Emergency Medicine

## 2015-03-31 DIAGNOSIS — I10 Essential (primary) hypertension: Secondary | ICD-10-CM | POA: Diagnosis not present

## 2015-03-31 DIAGNOSIS — R109 Unspecified abdominal pain: Secondary | ICD-10-CM | POA: Diagnosis present

## 2015-03-31 DIAGNOSIS — R319 Hematuria, unspecified: Secondary | ICD-10-CM | POA: Insufficient documentation

## 2015-03-31 DIAGNOSIS — Z87891 Personal history of nicotine dependence: Secondary | ICD-10-CM | POA: Insufficient documentation

## 2015-03-31 DIAGNOSIS — Z880 Allergy status to penicillin: Secondary | ICD-10-CM | POA: Insufficient documentation

## 2015-03-31 DIAGNOSIS — N23 Unspecified renal colic: Secondary | ICD-10-CM

## 2015-03-31 DIAGNOSIS — Z79899 Other long term (current) drug therapy: Secondary | ICD-10-CM | POA: Insufficient documentation

## 2015-03-31 LAB — CBC WITH DIFFERENTIAL/PLATELET
BASOS ABS: 0 10*3/uL (ref 0.0–0.1)
BASOS PCT: 0 % (ref 0–1)
Eosinophils Absolute: 0.1 10*3/uL (ref 0.0–0.7)
Eosinophils Relative: 3 % (ref 0–5)
HCT: 39.9 % (ref 39.0–52.0)
HEMOGLOBIN: 14.2 g/dL (ref 13.0–17.0)
LYMPHS ABS: 2 10*3/uL (ref 0.7–4.0)
Lymphocytes Relative: 39 % (ref 12–46)
MCH: 30.6 pg (ref 26.0–34.0)
MCHC: 35.6 g/dL (ref 30.0–36.0)
MCV: 86 fL (ref 78.0–100.0)
Monocytes Absolute: 0.3 10*3/uL (ref 0.1–1.0)
Monocytes Relative: 6 % (ref 3–12)
NEUTROS ABS: 2.6 10*3/uL (ref 1.7–7.7)
Neutrophils Relative %: 52 % (ref 43–77)
Platelets: 206 10*3/uL (ref 150–400)
RBC: 4.64 MIL/uL (ref 4.22–5.81)
RDW: 12.4 % (ref 11.5–15.5)
WBC: 5 10*3/uL (ref 4.0–10.5)

## 2015-03-31 LAB — URINALYSIS, ROUTINE W REFLEX MICROSCOPIC
BILIRUBIN URINE: NEGATIVE
Glucose, UA: >1000 mg/dL — AB
Ketones, ur: 15 mg/dL — AB
NITRITE: NEGATIVE
PH: 6 (ref 5.0–8.0)
Protein, ur: 100 mg/dL — AB
SPECIFIC GRAVITY, URINE: 1.024 (ref 1.005–1.030)
UROBILINOGEN UA: 1 mg/dL (ref 0.0–1.0)

## 2015-03-31 LAB — BASIC METABOLIC PANEL
Anion gap: 9 (ref 5–15)
BUN: 17 mg/dL (ref 6–20)
CALCIUM: 9.4 mg/dL (ref 8.9–10.3)
CO2: 23 mmol/L (ref 22–32)
Chloride: 102 mmol/L (ref 101–111)
Creatinine, Ser: 1.01 mg/dL (ref 0.61–1.24)
GFR calc Af Amer: >60 mL/min (ref >60)
Glucose, Bld: 240 mg/dL — ABNORMAL HIGH (ref 65–99)
POTASSIUM: 3.5 mmol/L (ref 3.5–5.1)
SODIUM: 134 mmol/L — AB (ref 135–145)

## 2015-03-31 LAB — URINE MICROSCOPIC-ADD ON

## 2015-03-31 MED ORDER — HYDROMORPHONE HCL 1 MG/ML IJ SOLN
1.0000 mg | Freq: Once | INTRAMUSCULAR | Status: AC
  Administered 2015-03-31: 1 mg via INTRAVENOUS
  Filled 2015-03-31: qty 1

## 2015-03-31 MED ORDER — SODIUM CHLORIDE 0.9 % IV SOLN
1000.0000 mL | Freq: Once | INTRAVENOUS | Status: AC
  Administered 2015-03-31: 1000 mL via INTRAVENOUS

## 2015-03-31 MED ORDER — SODIUM CHLORIDE 0.9 % IV SOLN: 1000.0000 mL | INTRAVENOUS | Status: DC

## 2015-03-31 MED ORDER — OXYCODONE-ACETAMINOPHEN 5-325 MG PO TABS: 1.0000 | ORAL_TABLET | ORAL | Status: DC | PRN

## 2015-03-31 MED ORDER — ONDANSETRON HCL 4 MG/2ML IJ SOLN
4.0000 mg | Freq: Once | INTRAMUSCULAR | Status: AC
  Administered 2015-03-31: 4 mg via INTRAVENOUS
  Filled 2015-03-31: qty 2

## 2015-03-31 NOTE — ED Notes (Signed)
Med given 

## 2015-03-31 NOTE — ED Notes (Signed)
MD at bedside. 

## 2015-03-31 NOTE — ED Notes (Signed)
The pt reports that he his pain is better

## 2015-03-31 NOTE — ED Provider Notes (Signed)
CSN: [id number]     Arrival date & time 03/31/15  0145 History  This chart was scribed for Eddie Booze, MD by Tanda Rockers, ED Scribe. This patient was seen in room D31C/D31C and the patient's care was started at 2:41 AM.   Chief Complaint  Patient presents with  . Hematuria  . Groin Pain   The history is provided by the patient. No language interpreter was used.     HPI Comments: Eddie Miranda is a 54 y.o. male with hx HTN and kidney stones who presents to the Emergency Department complaining of right groin pain x 1 day, worsening today. Pt rates the pain as a 10/10 on the pain scale. The pain is exacerbated with urinating and mildly alleviated with laying on his abdomen. Pt also complains of hematuria. Denies nausea, vomiting, fever, chills, flank pain, or any other associated symptoms. Pt has appointment on 04/20/2015 for right nephrolithotomy.   Past Medical History  Diagnosis Date  . Hypertension   . Sleep apnea   . Kidney stones    Past Surgical History  Procedure Laterality Date  . Renal stents     No family history on file. Social History  Substance Use Topics  . Smoking status: Former Games developer  . Smokeless tobacco: None  . Alcohol Use: No    Review of Systems  Constitutional: Negative for fever and chills.  Gastrointestinal: Negative for nausea and vomiting.  Genitourinary: Positive for hematuria. Negative for flank pain.       Groin pain  All other systems reviewed and are negative.  Allergies  Penicillins  Home Medications   Prior to Admission medications   Medication Sig Start Date End Date Taking? Authorizing Provider  atorvastatin (LIPITOR) 40 MG tablet Take 40 mg by mouth daily.  10/16/14   Historical Provider, MD  carvedilol (COREG) 6.25 MG tablet Take 1 tablet (6.25 mg total) by mouth 2 (two) times daily with a meal. 02/24/14   Reuben Likes, MD  cefdinir (OMNICEF) 300 MG capsule Take 1 capsule (300 mg total) by mouth 2 (two) times daily. Patient not  taking: Reported on 10/30/2014 05/23/14   [name], PA-C  ciprofloxacin-dexamethasone [facility]) otic suspension Place 4 drops into the right ear 2 (two) times daily. Patient not taking: Reported on 10/30/2014 05/23/14   [name], PA-C  doxycycline (VIBRAMYCIN) 100 MG capsule Take 1 capsule (100 mg total) by mouth 2 (two) times daily. Patient not taking: Reported on 10/30/2014 11/24/12   Hayden Rasmussen, NP  hydrochlorothiazide (HYDRODIURIL) 12.5 MG tablet Take 1 tablet (12.5 mg total) by mouth daily. 02/24/14   Reuben Likes, MD  ipratropium (ATROVENT) 0.06 % nasal spray Place 2 sprays into both nostrils 4 (four) times daily. Patient not taking: Reported on 10/30/2014 02/24/14   Reuben Likes, MD  lisinopril (PRINIVIL,ZESTRIL) 10 MG tablet Take 10 mg by mouth daily.  10/10/14   Historical Provider, MD  methocarbamol (ROBAXIN-750) 750 MG tablet Take 1 tablet (750 mg total) by mouth 4 (four) times daily. 10/30/14   Lorre Nick, MD  Tamsulosin HCl (FLOMAX) 0.4 MG CAPS Take 1 capsule (0.4 mg total) by mouth daily. Patient not taking: Reported on 10/30/2014 12/16/11   Azalia Bilis, MD  traMADol [name]) 50 MG tablet 1 to 2 tablets every 8 hours as needed for cough Patient not taking: Reported on 10/30/2014 02/24/14   Reuben Likes, MD  triamcinolone cream (KENALOG) 0.1 % Apply 1 application topically 3 (three) times daily. Patient not  taking: Reported on 10/30/2014 02/24/14   Reuben Likes, MD   Triage Vitals: BP 122/76 mmHg  Pulse 78  Temp(Src) 98.3 F (36.8 C) (Oral)  Resp 20  SpO2 96%   Physical Exam  Constitutional: He is oriented to person, place, and time. He appears well-developed and well-nourished. No distress.  HENT:  Head: Normocephalic and atraumatic.  Eyes: Conjunctivae and EOM are normal. Pupils are equal, round, and reactive to light.  Neck: Normal range of motion. Neck supple. No JVD present.  Cardiovascular: Normal rate, regular rhythm and normal heart sounds.   No murmur  heard. Pulmonary/Chest: Effort normal and breath sounds normal. He has no wheezes. He has no rales. He exhibits no tenderness.  Abdominal: Soft. He exhibits no distension and no mass. There is tenderness. There is no rebound and no guarding.  Moderate right suprapubic tenderness  Musculoskeletal: Normal range of motion. He exhibits no edema or tenderness.  Lymphadenopathy:    He has no cervical adenopathy.  Neurological: He is alert and oriented to person, place, and time. No cranial nerve deficit. He exhibits normal muscle tone. Coordination normal.  Skin: Skin is warm and dry. No rash noted.  Psychiatric: He has a normal mood and affect. His behavior is normal. Judgment and thought content normal.  Nursing note and vitals reviewed.   ED Course  Procedures (including critical care time)  DIAGNOSTIC STUDIES: Oxygen Saturation is 96% on RA, normal by my interpretation.    COORDINATION OF CARE: 2:45 AM-Discussed treatment plan which includes US Renal, BMP, CBC, pain medication with pt at bedside and pt agreed to plan.   Labs Review Results for orders placed or performed during the hospital encounter of 03/31/15  Urinalysis, Routine w reflex microscopic-may I&O cath if menses (not at [facility])  Result Value Ref Range   Color, Urine AMBER (A) YELLOW   APPearance CLOUDY (A) CLEAR   Specific Gravity, Urine 1.024 1.005 - 1.030   pH 6.0 5.0 - 8.0   Glucose, UA >1000 (A) NEGATIVE mg/dL   Hgb urine dipstick LARGE (A) NEGATIVE   Bilirubin Urine NEGATIVE NEGATIVE   Ketones, ur 15 (A) NEGATIVE mg/dL   Protein, ur 409 (A) NEGATIVE mg/dL   Urobilinogen, UA 1.0 0.0 - 1.0 mg/dL   Nitrite NEGATIVE NEGATIVE   Leukocytes, UA MODERATE (A) NEGATIVE  Basic metabolic panel  Result Value Ref Range   Sodium 134 (L) 135 - 145 mmol/L   Potassium 3.5 3.5 - 5.1 mmol/L   Chloride 102 101 - 111 mmol/L   CO2 23 22 - 32 mmol/L   Glucose, Bld 240 (H) 65 - 99 mg/dL   BUN 17 6 - 20 mg/dL   Creatinine, Ser 8.11  0.61 - 1.24 mg/dL   Calcium 9.4 8.9 - 91.4 mg/dL   GFR calc non Af Amer >60 >60 mL/min   GFR calc Af Amer >60 >60 mL/min   Anion gap 9 5 - 15  CBC with Differential  Result Value Ref Range   WBC 5.0 4.0 - 10.5 K/uL   RBC 4.64 4.22 - 5.81 MIL/uL   Hemoglobin 14.2 13.0 - 17.0 g/dL   HCT 78.2 95.6 - 21.3 %   MCV 86.0 78.0 - 100.0 fL   MCH 30.6 26.0 - 34.0 pg   MCHC 35.6 30.0 - 36.0 g/dL   RDW 08.6 57.8 - 46.9 %   Platelets 206 150 - 400 K/uL   Neutrophils Relative % 52 43 - 77 %   Neutro Abs 2.6  1.7 - 7.7 K/uL   Lymphocytes Relative 39 12 - 46 %   Lymphs Abs 2.0 0.7 - 4.0 K/uL   Monocytes Relative 6 3 - 12 %   Monocytes Absolute 0.3 0.1 - 1.0 K/uL   Eosinophils Relative 3 0 - 5 %   Eosinophils Absolute 0.1 0.0 - 0.7 K/uL   Basophils Relative 0 0 - 1 %   Basophils Absolute 0.0 0.0 - 0.1 K/uL  Urine microscopic-add on  Result Value Ref Range   Squamous Epithelial / LPF RARE RARE   WBC, UA 11-20 <3 WBC/hpf   RBC / HPF TOO NUMEROUS TO COUNT <3 RBC/hpf   Bacteria, UA RARE RARE   Urine-Other MUCOUS PRESENT    Imaging Review US Renal  03/31/2015   CLINICAL DATA:  Acute onset of right groin pain. Dysuria and hematuria. Initial encounter.  EXAM: RENAL / URINARY TRACT ULTRASOUND COMPLETE  COMPARISON:  CT of the abdomen and pelvis performed 12/16/2011  FINDINGS: Right Kidney:  Length: 11.8 cm. Echogenicity within normal limits. A 1.3 cm stone is noted at the lower pole of the right kidney. No mass or hydronephrosis visualized.  Left Kidney:  Length: 12.7 cm. Echogenicity within normal limits. A 1.7 cm cyst is noted at the the lower pole of the left kidney. No hydronephrosis visualized.  Bladder:  Appears normal for degree of bladder distention. Several stones and apparent debris are noted at the base of the bladder.  IMPRESSION: 1. No evidence of hydronephrosis. 2. Several stones and apparent debris noted at the base of the bladder. 3. Small left renal cyst noted. 4. 1.3 cm nonobstructing stone  at the lower pole of the right kidney.   Electronically Signed   By: Roanna Raider M.D.   On: 03/31/2015 04:18   I, Eddie Miranda, personally reviewed and evaluated these images and lab results as part of my medical decision-making.   MDM   Final diagnoses:  Renal colic    Patient with known history of kidney stones comes in with symptoms consistent with renal colic. He is scheduled to have a stone removed on September 2. He appears significantly uncomfortable. IV is started and is given normal saline and hydromorphone for pain with good relief of pain. Ultrasound shows some stones and debris and the pace the bladder which probably accounts for his pain. However, no hydronephrosis is present. He is discharged with prescription for oxycodone have acetaminophen and is referred back to his urologist.   I personally performed the services described in this documentation, which was scribed in my presence. The recorded information has been reviewed and is accurate.       Eddie Booze, MD 03/31/15 [phone]

## 2015-03-31 NOTE — Discharge Instructions (Signed)
Drink plenty of fluids. Return if pain is not being adequately controlled or if you start running a fever.  Acetaminophen; Oxycodone tablets What is this medicine? ACETAMINOPHEN; OXYCODONE (a set a MEE noe fen; ox i KOE done) is a pain reliever. It is used to treat mild to moderate pain. This medicine may be used for other purposes; ask your health care provider or pharmacist if you have questions. COMMON BRAND NAME(S): Endocet, Magnacet, Narvox, Percocet, Perloxx, Primalev, Primlev, Roxicet, Xolox What should I tell my health care provider before I take this medicine? They need to know if you have any of these conditions: -brain tumor -Crohn's disease, inflammatory bowel disease, or ulcerative colitis -drug abuse or addiction -head injury -heart or circulation problems -if you often drink alcohol -kidney disease or problems going to the bathroom -liver disease -lung disease, asthma, or breathing problems -an unusual or allergic reaction to acetaminophen, oxycodone, other opioid analgesics, other medicines, foods, dyes, or preservatives -pregnant or trying to get pregnant -breast-feeding How should I use this medicine? Take this medicine by mouth with a full glass of water. Follow the directions on the prescription label. Take your medicine at regular intervals. Do not take your medicine more often than directed. Talk to your pediatrician regarding the use of this medicine in children. Special care may be needed. Patients over 66 years old may have a stronger reaction and need a smaller dose. Overdosage: If you think you have taken too much of this medicine contact a poison control center or emergency room at once. NOTE: This medicine is only for you. Do not share this medicine with others. What if I miss a dose? If you miss a dose, take it as soon as you can. If it is almost time for your next dose, take only that dose. Do not take double or extra doses. What may interact with this  medicine? -alcohol -antihistamines -barbiturates like amobarbital, butalbital, butabarbital, methohexital, pentobarbital, phenobarbital, thiopental, and secobarbital -benztropine -drugs for bladder problems like solifenacin, trospium, oxybutynin, tolterodine, hyoscyamine, and methscopolamine -drugs for breathing problems like ipratropium and tiotropium -drugs for certain stomach or intestine problems like propantheline, homatropine methylbromide, glycopyrrolate, atropine, belladonna, and dicyclomine -general anesthetics like etomidate, ketamine, nitrous oxide, propofol, desflurane, enflurane, halothane, isoflurane, and sevoflurane -medicines for depression, anxiety, or psychotic disturbances -medicines for sleep -muscle relaxants -naltrexone -narcotic medicines (opiates) for pain -phenothiazines like perphenazine, thioridazine, chlorpromazine, mesoridazine, fluphenazine, prochlorperazine, promazine, and trifluoperazine -scopolamine -tramadol -trihexyphenidyl This list may not describe all possible interactions. Give your health care provider a list of all the medicines, herbs, non-prescription drugs, or dietary supplements you use. Also tell them if you smoke, drink alcohol, or use illegal drugs. Some items may interact with your medicine. What should I watch for while using this medicine? Tell your doctor or health care professional if your pain does not go away, if it gets worse, or if you have new or a different type of pain. You may develop tolerance to the medicine. Tolerance means that you will need a higher dose of the medication for pain relief. Tolerance is normal and is expected if you take this medicine for a long time. Do not suddenly stop taking your medicine because you may develop a severe reaction. Your body becomes used to the medicine. This does NOT mean you are addicted. Addiction is a behavior related to getting and using a drug for a non-medical reason. If you have pain, you  have a medical reason to take pain medicine. Your doctor  will tell you how much medicine to take. If your doctor wants you to stop the medicine, the dose will be slowly lowered over time to avoid any side effects. You may get drowsy or dizzy. Do not drive, use machinery, or do anything that needs mental alertness until you know how this medicine affects you. Do not stand or sit up quickly, especially if you are an older patient. This reduces the risk of dizzy or fainting spells. Alcohol may interfere with the effect of this medicine. Avoid alcoholic drinks. There are different types of narcotic medicines (opiates) for pain. If you take more than one type at the same time, you may have more side effects. Give your health care provider a list of all medicines you use. Your doctor will tell you how much medicine to take. Do not take more medicine than directed. Call emergency for help if you have problems breathing. The medicine will cause constipation. Try to have a bowel movement at least every 2 to 3 days. If you do not have a bowel movement for 3 days, call your doctor or health care professional. Do not take Tylenol (acetaminophen) or medicines that have acetaminophen with this medicine. Too much acetaminophen can be very dangerous. Many nonprescription medicines contain acetaminophen. Always read the labels carefully to avoid taking more acetaminophen. What side effects may I notice from receiving this medicine? Side effects that you should report to your doctor or health care professional as soon as possible: -allergic reactions like skin rash, itching or hives, swelling of the face, lips, or tongue -breathing difficulties, wheezing -confusion -light headedness or fainting spells -severe stomach pain -unusually weak or tired -yellowing of the skin or the whites of the eyes Side effects that usually do not require medical attention (report to your doctor or health care professional if they continue  or are bothersome): -dizziness -drowsiness -nausea -vomiting This list may not describe all possible side effects. Call your doctor for medical advice about side effects. You may report side effects to FDA at 1-800-FDA-1088. Where should I keep my medicine? Keep out of the reach of children. This medicine can be abused. Keep your medicine in a safe place to protect it from theft. Do not share this medicine with anyone. Selling or giving away this medicine is dangerous and against the law. Store at room temperature between 20 and 25 degrees C (68 and 77 degrees F). Keep container tightly closed. Protect from light. This medicine may cause accidental overdose and death if it is taken by other adults, children, or pets. Flush any unused medicine down the toilet to reduce the chance of harm. Do not use the medicine after the expiration date. NOTE: This sheet is a summary. It may not cover all possible information. If you have questions about this medicine, talk to your doctor, pharmacist, or health care provider.  2015, [employer]. (2013-03-28 13:17:35)

## 2015-03-31 NOTE — ED Notes (Signed)
The pt reports  That he is ready to go home.  He feels better

## 2015-03-31 NOTE — ED Notes (Signed)
C/o groin pain, pain with urination, and hematuria since yesterday.  History of kidney stones.

## 2015-04-13 NOTE — Patient Instructions (Addendum)
Ludwin Flahive  04/13/2015   Your procedure is scheduled on: Friday September 2nd 2016  Report to [facility] Main  Entrance Radiology at 730 am   Call this number if you have problems the morning of surgery [phone]   Remember:  Do not eat food or drink liquids :After Midnight.     Take these medicines the morning of surgery with A SIP OF WATER: carvedilol (coreg)                               You may not have any metal on your body including hair pins and              piercings  Do not wear jewelry, make-up, lotions, powders or perfumes, deodorant             Do not wear nail polish.  Do not shave  48 hours prior to surgery.              Men may shave face and neck.   Do not bring valuables to the hospital. [facility] IS NOT             RESPONSIBLE   FOR VALUABLES.  Contacts, dentures or bridgework may not be worn into surgery.  Leave suitcase in the car. After surgery it may be brought to your room.     Patients discharged the day of surgery will not be allowed to drive home.  Name and phone number of your driver:  Special Instructions: N/A              Please read over the following fact sheets you were given: _____________________________________________________________________             [facility] - Preparing for Surgery Before surgery, you can play an important role.  Because skin is not sterile, your skin needs to be as free of germs as possible.  You can reduce the number of germs on your skin by washing with CHG (chlorahexidine gluconate) soap before surgery.  CHG is an antiseptic cleaner which kills germs and bonds with the skin to continue killing germs even after washing. Please DO NOT use if you have an allergy to CHG or antibacterial soaps.  If your skin becomes reddened/irritated stop using the CHG and inform your nurse when you arrive at Short Stay. Do not shave (including legs and underarms) for at least 48 hours prior to the  first CHG shower.  You may shave your face/neck. Please follow these instructions carefully:  1.  Shower with CHG Soap the night before surgery and the  morning of Surgery.  2.  If you choose to wash your hair, wash your hair first as usual with your  normal  shampoo.  3.  After you shampoo, rinse your hair and body thoroughly to remove the  shampoo.                           4.  Use CHG as you would any other liquid soap.  You can apply chg directly  to the skin and wash                       Gently with a scrungie or clean washcloth.  5.  Apply the CHG Soap to your body  ONLY FROM THE NECK DOWN.   Do not use on face/ open                           Wound or open sores. Avoid contact with eyes, ears mouth and genitals (private parts).                       Wash face,  Genitals (private parts) with your normal soap.             6.  Wash thoroughly, paying special attention to the area where your surgery  will be performed.  7.  Thoroughly rinse your body with warm water from the neck down.  8.  DO NOT shower/wash with your normal soap after using and rinsing off  the CHG Soap.                9.  Pat yourself dry with a clean towel.            10.  Wear clean pajamas.            11.  Place clean sheets on your bed the night of your first shower and do not  sleep with pets. Day of Surgery : Do not apply any lotions/deodorants the morning of surgery.  Please wear clean clothes to the hospital/surgery center.  FAILURE TO FOLLOW THESE INSTRUCTIONS MAY RESULT IN THE CANCELLATION OF YOUR SURGERY PATIENT SIGNATURE_________________________________  NURSE SIGNATURE__________________________________  ________________________________________________________________________

## 2015-04-13 NOTE — Progress Notes (Addendum)
Bmet, cbc with dif 03-31-15 epic

## 2015-04-17 ENCOUNTER — Inpatient Hospital Stay (HOSPITAL_COMMUNITY)
Admission: RE | Admit: 2015-04-17 | Discharge: 2015-04-17 | Disposition: A | Discharge status: Home / Self Care | Payer: Medicaid Other | Source: Ambulatory Visit

## 2015-04-17 ENCOUNTER — Encounter (HOSPITAL_COMMUNITY): Payer: Self-pay | Admitting: *Deleted

## 2015-04-19 ENCOUNTER — Other Ambulatory Visit: Payer: Self-pay | Admitting: Radiology

## 2015-04-20 ENCOUNTER — Ambulatory Visit (HOSPITAL_COMMUNITY)
Admission: RE | Admit: 2015-04-20 | Discharge: 2015-04-20 | Disposition: A | Discharge status: Home / Self Care | Payer: Medicaid Other | Source: Ambulatory Visit | Attending: Urology | Admitting: Urology

## 2015-04-20 ENCOUNTER — Encounter (HOSPITAL_COMMUNITY): Payer: Self-pay | Admitting: *Deleted

## 2015-04-20 ENCOUNTER — Ambulatory Visit (HOSPITAL_COMMUNITY): Payer: Medicaid Other | Admitting: Anesthesiology

## 2015-04-20 ENCOUNTER — Ambulatory Visit (HOSPITAL_COMMUNITY)
Admission: RE | Admit: 2015-04-20 | Discharge: 2015-04-21 | Disposition: A | Discharge status: Home / Self Care | Payer: Medicaid Other | Source: Ambulatory Visit | Attending: Urology | Admitting: Urology

## 2015-04-20 ENCOUNTER — Ambulatory Visit (HOSPITAL_COMMUNITY): Payer: Medicaid Other

## 2015-04-20 ENCOUNTER — Encounter (HOSPITAL_COMMUNITY)
Admission: RE | Disposition: A | Discharge status: Home / Self Care | Payer: Self-pay | Source: Ambulatory Visit | Attending: Urology

## 2015-04-20 DIAGNOSIS — N200 Calculus of kidney: Secondary | ICD-10-CM

## 2015-04-20 HISTORY — PX: NEPHROLITHOTOMY: SHX5134

## 2015-04-20 LAB — PROTIME-INR
INR: 0.95 (ref 0.00–1.49)
Prothrombin Time: 12.9 seconds (ref 11.6–15.2)

## 2015-04-20 LAB — BASIC METABOLIC PANEL
ANION GAP: 9 (ref 5–15)
BUN: 17 mg/dL (ref 6–20)
CHLORIDE: 104 mmol/L (ref 101–111)
CO2: 25 mmol/L (ref 22–32)
Calcium: 9.4 mg/dL (ref 8.9–10.3)
Creatinine, Ser: 0.77 mg/dL (ref 0.61–1.24)
Glucose, Bld: 121 mg/dL — ABNORMAL HIGH (ref 65–99)
POTASSIUM: 2.8 mmol/L — AB (ref 3.5–5.1)
SODIUM: 138 mmol/L (ref 135–145)

## 2015-04-20 LAB — CBC WITH DIFFERENTIAL/PLATELET
BASOS ABS: 0 10*3/uL (ref 0.0–0.1)
Basophils Relative: 0 % (ref 0–1)
EOS PCT: 3 % (ref 0–5)
Eosinophils Absolute: 0.1 10*3/uL (ref 0.0–0.7)
HEMATOCRIT: 40.9 % (ref 39.0–52.0)
HEMOGLOBIN: 14.7 g/dL (ref 13.0–17.0)
LYMPHS PCT: 38 % (ref 12–46)
Lymphs Abs: 1.9 10*3/uL (ref 0.7–4.0)
MCH: 30.4 pg (ref 26.0–34.0)
MCHC: 35.9 g/dL (ref 30.0–36.0)
MCV: 84.7 fL (ref 78.0–100.0)
Monocytes Absolute: 0.3 10*3/uL (ref 0.1–1.0)
Monocytes Relative: 6 % (ref 3–12)
NEUTROS ABS: 2.8 10*3/uL (ref 1.7–7.7)
NEUTROS PCT: 53 % (ref 43–77)
PLATELETS: 237 10*3/uL (ref 150–400)
RBC: 4.83 MIL/uL (ref 4.22–5.81)
RDW: 12.6 % (ref 11.5–15.5)
WBC: 5.1 10*3/uL (ref 4.0–10.5)

## 2015-04-20 SURGERY — NEPHROLITHOTOMY PERCUTANEOUS
Anesthesia: General | Laterality: Right

## 2015-04-20 MED ORDER — CIPROFLOXACIN IN D5W 400 MG/200ML IV SOLN: 400.0000 mg | INTRAVENOUS | Status: DC

## 2015-04-20 MED ORDER — FENTANYL CITRATE (PF) 100 MCG/2ML IJ SOLN
INTRAMUSCULAR | Status: AC | PRN
  Administered 2015-04-20: 50 ug via INTRAVENOUS
  Administered 2015-04-20: 25 ug via INTRAVENOUS

## 2015-04-20 MED ORDER — ONDANSETRON HCL 4 MG/2ML IJ SOLN
INTRAMUSCULAR | Status: AC
  Filled 2015-04-20: qty 2

## 2015-04-20 MED ORDER — SUGAMMADEX SODIUM 500 MG/5ML IV SOLN
INTRAVENOUS | Status: DC | PRN
  Administered 2015-04-20: 400 mg via INTRAVENOUS

## 2015-04-20 MED ORDER — OXYCODONE-ACETAMINOPHEN 5-325 MG PO TABS
1.0000 | ORAL_TABLET | ORAL | Status: DC | PRN
  Administered 2015-04-20 – 2015-04-21 (×3): 1 via ORAL
  Filled 2015-04-20 (×3): qty 1

## 2015-04-20 MED ORDER — POTASSIUM CHLORIDE 2 MEQ/ML IV SOLN
INTRAVENOUS | Status: DC
  Administered 2015-04-20: 10:00:00 via INTRAVENOUS
  Filled 2015-04-20 (×4): qty 1000

## 2015-04-20 MED ORDER — MIDAZOLAM HCL 2 MG/2ML IJ SOLN
INTRAMUSCULAR | Status: AC
  Administered 2015-04-20: 2 mg via INTRAVENOUS
  Filled 2015-04-20: qty 4

## 2015-04-20 MED ORDER — SODIUM CHLORIDE 0.9 % IJ SOLN
INTRAMUSCULAR | Status: AC
  Filled 2015-04-20: qty 10

## 2015-04-20 MED ORDER — CIPROFLOXACIN IN D5W 400 MG/200ML IV SOLN
INTRAVENOUS | Status: AC
  Filled 2015-04-20: qty 200

## 2015-04-20 MED ORDER — ACETAMINOPHEN 325 MG PO TABS: 650.0000 mg | ORAL_TABLET | ORAL | Status: DC | PRN

## 2015-04-20 MED ORDER — EPHEDRINE SULFATE 50 MG/ML IJ SOLN
INTRAMUSCULAR | Status: AC
  Filled 2015-04-20: qty 1

## 2015-04-20 MED ORDER — LACTATED RINGERS IV SOLN
INTRAVENOUS | Status: DC
  Administered 2015-04-20: 08:00:00 via INTRAVENOUS

## 2015-04-20 MED ORDER — DEXTROSE-NACL 5-0.45 % IV SOLN
INTRAVENOUS | Status: DC
  Administered 2015-04-20 – 2015-04-21 (×3): via INTRAVENOUS

## 2015-04-20 MED ORDER — OXYBUTYNIN CHLORIDE 5 MG PO TABS: 5.0000 mg | ORAL_TABLET | Freq: Three times a day (TID) | ORAL | Status: DC | PRN

## 2015-04-20 MED ORDER — SUGAMMADEX SODIUM 500 MG/5ML IV SOLN
INTRAVENOUS | Status: AC
  Filled 2015-04-20: qty 5

## 2015-04-20 MED ORDER — SODIUM CHLORIDE 0.9 % IR SOLN
Status: DC | PRN
  Administered 2015-04-20: 12000 mL

## 2015-04-20 MED ORDER — OXYCODONE HCL 10 MG PO TABS: 10.0000 mg | ORAL_TABLET | ORAL | Status: DC | PRN

## 2015-04-20 MED ORDER — DEXAMETHASONE SODIUM PHOSPHATE 10 MG/ML IJ SOLN
INTRAMUSCULAR | Status: DC | PRN
Start: 2015-04-20 — End: 2015-04-20
  Administered 2015-04-20: 10 mg via INTRAVENOUS

## 2015-04-20 MED ORDER — FENTANYL CITRATE (PF) 100 MCG/2ML IJ SOLN
INTRAMUSCULAR | Status: AC
  Administered 2015-04-20: 100 ug via INTRAVENOUS
  Filled 2015-04-20: qty 2

## 2015-04-20 MED ORDER — LIDOCAINE HCL 1 % IJ SOLN
INTRAMUSCULAR | Status: AC
  Filled 2015-04-20: qty 20

## 2015-04-20 MED ORDER — ROCURONIUM BROMIDE 100 MG/10ML IV SOLN
INTRAVENOUS | Status: AC
  Filled 2015-04-20: qty 1

## 2015-04-20 MED ORDER — MEPERIDINE HCL 50 MG/ML IJ SOLN: 6.2500 mg | INTRAMUSCULAR | Status: DC | PRN

## 2015-04-20 MED ORDER — LIDOCAINE HCL (CARDIAC) 20 MG/ML IV SOLN
INTRAVENOUS | Status: DC | PRN
  Administered 2015-04-20: 100 mg via INTRAVENOUS

## 2015-04-20 MED ORDER — SODIUM CHLORIDE 0.9 % IV SOLN: INTRAVENOUS | Status: DC

## 2015-04-20 MED ORDER — PROPOFOL 10 MG/ML IV BOLUS
INTRAVENOUS | Status: AC
  Filled 2015-04-20: qty 20

## 2015-04-20 MED ORDER — ZOLPIDEM TARTRATE 5 MG PO TABS
5.0000 mg | ORAL_TABLET | Freq: Every evening | ORAL | Status: DC | PRN
  Administered 2015-04-21: 5 mg via ORAL
  Filled 2015-04-20: qty 1

## 2015-04-20 MED ORDER — FENTANYL CITRATE (PF) 100 MCG/2ML IJ SOLN
INTRAMUSCULAR | Status: AC
  Filled 2015-04-20: qty 2

## 2015-04-20 MED ORDER — LACTATED RINGERS IV SOLN
INTRAVENOUS | Status: DC
  Administered 2015-04-20: 1000 mL via INTRAVENOUS

## 2015-04-20 MED ORDER — FENTANYL CITRATE (PF) 100 MCG/2ML IJ SOLN
25.0000 ug | INTRAMUSCULAR | Status: DC | PRN
  Administered 2015-04-20 (×4): 25 ug via INTRAVENOUS

## 2015-04-20 MED ORDER — PROMETHAZINE HCL 25 MG/ML IJ SOLN: 6.2500 mg | INTRAMUSCULAR | Status: DC | PRN

## 2015-04-20 MED ORDER — IOHEXOL 300 MG/ML  SOLN
10.0000 mL | Freq: Once | INTRAMUSCULAR | Status: DC | PRN
  Administered 2015-04-20: 10 mL
  Filled 2015-04-20: qty 10

## 2015-04-20 MED ORDER — ROCURONIUM BROMIDE 100 MG/10ML IV SOLN
INTRAVENOUS | Status: DC | PRN
  Administered 2015-04-20: 60 mg via INTRAVENOUS

## 2015-04-20 MED ORDER — CIPROFLOXACIN IN D5W 200 MG/100ML IV SOLN
200.0000 mg | Freq: Two times a day (BID) | INTRAVENOUS | Status: AC
  Administered 2015-04-20: 200 mg via INTRAVENOUS
  Filled 2015-04-20: qty 100

## 2015-04-20 MED ORDER — MIDAZOLAM HCL 2 MG/2ML IJ SOLN
INTRAMUSCULAR | Status: AC | PRN
  Administered 2015-04-20: 0.5 mg via INTRAVENOUS
  Administered 2015-04-20: 1 mg via INTRAVENOUS
  Administered 2015-04-20: 0.5 mg via INTRAVENOUS

## 2015-04-20 MED ORDER — HYDRALAZINE HCL 20 MG/ML IJ SOLN
10.0000 mg | Freq: Four times a day (QID) | INTRAMUSCULAR | Status: DC | PRN
  Administered 2015-04-20: 10 mg via INTRAVENOUS
  Filled 2015-04-20: qty 1

## 2015-04-20 MED ORDER — LACTATED RINGERS IV SOLN: INTRAVENOUS | Status: DC

## 2015-04-20 MED ORDER — DEXAMETHASONE SODIUM PHOSPHATE 10 MG/ML IJ SOLN
INTRAMUSCULAR | Status: AC
  Filled 2015-04-20: qty 1

## 2015-04-20 MED ORDER — LIDOCAINE HCL (CARDIAC) 20 MG/ML IV SOLN
INTRAVENOUS | Status: AC
  Filled 2015-04-20: qty 5

## 2015-04-20 MED ORDER — EPHEDRINE SULFATE 50 MG/ML IJ SOLN
INTRAMUSCULAR | Status: DC | PRN
  Administered 2015-04-20: 10 mg via INTRAVENOUS

## 2015-04-20 MED ORDER — PROPOFOL 10 MG/ML IV BOLUS
INTRAVENOUS | Status: DC | PRN
  Administered 2015-04-20: 200 mg via INTRAVENOUS

## 2015-04-20 MED ORDER — IOHEXOL 300 MG/ML  SOLN
INTRAMUSCULAR | Status: DC | PRN
  Administered 2015-04-20: 50 mL

## 2015-04-20 MED ORDER — ONDANSETRON HCL 4 MG/2ML IJ SOLN
INTRAMUSCULAR | Status: DC | PRN
  Administered 2015-04-20: 4 mg via INTRAVENOUS

## 2015-04-20 MED ORDER — HYDROMORPHONE HCL 1 MG/ML IJ SOLN
0.5000 mg | INTRAMUSCULAR | Status: DC | PRN
  Administered 2015-04-20: 1 mg via INTRAVENOUS
  Administered 2015-04-20: 0.5 mg via INTRAVENOUS
  Administered 2015-04-21: 1 mg via INTRAVENOUS
  Filled 2015-04-20 (×3): qty 1

## 2015-04-20 MED ORDER — ROCURONIUM BROMIDE 100 MG/10ML IV SOLN: INTRAVENOUS | Status: DC | PRN

## 2015-04-20 MED ORDER — FENTANYL CITRATE (PF) 250 MCG/5ML IJ SOLN
INTRAMUSCULAR | Status: AC
  Filled 2015-04-20: qty 25

## 2015-04-20 MED ORDER — CIPROFLOXACIN IN D5W 400 MG/200ML IV SOLN
400.0000 mg | INTRAVENOUS | Status: AC
  Administered 2015-04-20: 400 mg via INTRAVENOUS

## 2015-04-20 MED ORDER — MIDAZOLAM HCL 2 MG/2ML IJ SOLN
INTRAMUSCULAR | Status: AC
  Filled 2015-04-20: qty 4

## 2015-04-20 MED ORDER — ONDANSETRON HCL 4 MG/2ML IJ SOLN
4.0000 mg | INTRAMUSCULAR | Status: DC | PRN
Start: 2015-04-20 — End: 2015-04-21

## 2015-04-20 MED ORDER — SENNOSIDES-DOCUSATE SODIUM 8.6-50 MG PO TABS
2.0000 | ORAL_TABLET | Freq: Every day | ORAL | Status: DC
  Administered 2015-04-20: 2 via ORAL
  Filled 2015-04-20 (×3): qty 2

## 2015-04-20 SURGICAL SUPPLY — 52 items
APPLICATOR SURGIFLO ENDO (HEMOSTASIS) ×2 IMPLANT
BAG URINE DRAINAGE (UROLOGICAL SUPPLIES) ×2 IMPLANT
BASKET ZERO TIP NITINOL 2.4FR (BASKET) IMPLANT
BENZOIN TINCTURE PRP APPL 2/3 (GAUZE/BANDAGES/DRESSINGS) IMPLANT
BLADE SURG 15 STRL LF DISP TIS (BLADE) ×1 IMPLANT
BLADE SURG 15 STRL SS (BLADE) ×1
CATH FOLEY 2W COUNCIL 20FR 5CC (CATHETERS) IMPLANT
CATH FOLEY 2WAY SLVR  5CC 18FR (CATHETERS)
CATH FOLEY 2WAY SLVR 5CC 18FR (CATHETERS) IMPLANT
CATH IMAGER II 65CM (CATHETERS) IMPLANT
CATH X-FORCE N30 NEPHROSTOMY (TUBING) ×2 IMPLANT
COVER SURGICAL LIGHT HANDLE (MISCELLANEOUS) ×2 IMPLANT
DRAPE C-ARM 42X120 X-RAY (DRAPES) ×2 IMPLANT
DRAPE CAMERA CLOSED 9X96 (DRAPES) IMPLANT
DRAPE LINGEMAN PERC (DRAPES) ×2 IMPLANT
DRAPE SURG IRRIG POUCH 19X23 (DRAPES) ×2 IMPLANT
DRSG PAD ABDOMINAL 8X10 ST (GAUZE/BANDAGES/DRESSINGS) IMPLANT
DRSG TEGADERM 8X12 (GAUZE/BANDAGES/DRESSINGS) ×2 IMPLANT
FIBER LASER FLEXIVA 1000 (UROLOGICAL SUPPLIES) IMPLANT
FIBER LASER FLEXIVA 200 (UROLOGICAL SUPPLIES) IMPLANT
FIBER LASER FLEXIVA 365 (UROLOGICAL SUPPLIES) IMPLANT
FIBER LASER FLEXIVA 550 (UROLOGICAL SUPPLIES) IMPLANT
FIBER LASER TRAC TIP (UROLOGICAL SUPPLIES) IMPLANT
FLOSEAL 10ML (HEMOSTASIS) ×2 IMPLANT
GAUZE SPONGE 4X4 12PLY STRL (GAUZE/BANDAGES/DRESSINGS) ×2 IMPLANT
GAUZE SPONGE 4X4 16PLY XRAY LF (GAUZE/BANDAGES/DRESSINGS) IMPLANT
GLOVE BIOGEL M 8.0 STRL (GLOVE) ×2 IMPLANT
GOWN STRL REUS W/TWL XL LVL3 (GOWN DISPOSABLE) ×2 IMPLANT
GUIDEWIRE AMPLAZ .035X145 (WIRE) ×4 IMPLANT
GUIDEWIRE STR DUAL SENSOR (WIRE) IMPLANT
HOLDER FOLEY CATH W/STRAP (MISCELLANEOUS) ×2 IMPLANT
KIT BASIN OR (CUSTOM PROCEDURE TRAY) ×2 IMPLANT
LIQUID BAND (GAUZE/BANDAGES/DRESSINGS) ×2 IMPLANT
MANIFOLD NEPTUNE II (INSTRUMENTS) ×2 IMPLANT
NS IRRIG 1000ML POUR BTL (IV SOLUTION) ×4 IMPLANT
PACK BASIC VI WITH GOWN DISP (CUSTOM PROCEDURE TRAY) ×2 IMPLANT
PROBE LITHOCLAST ULTRA 3.8X403 (UROLOGICAL SUPPLIES) ×2 IMPLANT
PROBE PNEUMATIC 1.0MMX570MM (UROLOGICAL SUPPLIES) ×2 IMPLANT
SET IRRIG Y TYPE TUR BLADDER L (SET/KITS/TRAYS/PACK) ×2 IMPLANT
SET WARMING FLUID IRRIGATION (MISCELLANEOUS) IMPLANT
SHEATH PEELAWAY SET 9 (SHEATH) ×2 IMPLANT
STENT CONTOUR 6FRX26X.038 (STENTS) ×2 IMPLANT
STONE CATCHER W/TUBE ADAPTER (UROLOGICAL SUPPLIES) ×2 IMPLANT
SUT MNCRL AB 4-0 PS2 18 (SUTURE) IMPLANT
SUT SILK 2 0 30  PSL (SUTURE) ×1
SUT SILK 2 0 30 PSL (SUTURE) ×1 IMPLANT
SYR 20CC LL (SYRINGE) ×4 IMPLANT
SYRINGE 10CC LL (SYRINGE) ×2 IMPLANT
TOWEL OR NON WOVEN STRL DISP B (DISPOSABLE) ×2 IMPLANT
TRAY FOLEY W/METER SILVER 14FR (SET/KITS/TRAYS/PACK) IMPLANT
TRAY FOLEY W/METER SILVER 16FR (SET/KITS/TRAYS/PACK) IMPLANT
TUBING CONNECTING 10 (TUBING) ×4 IMPLANT

## 2015-04-20 NOTE — H&P (Signed)
Chief Complaint: Patient was seen in consultation today for right percutaneous nephrostomy/nephroureteral catheter placement   Referring Physician(s): Ottelin,M  History of Present Illness: Eddie Miranda is a 54 y.o. male with history of hypertension, nephrolithiasis, sleep apnea, and recent hospitalization in [place] secondary to sepsis from obstructing right ureteral stone. Patient underwent stent placement at that time and since then has been having voiding irritative symptoms including right flank pain, intermittent gross hematuria, dysuria, frequency and urgency. He has been treated with antibiotic therapy. Recent KUB has revealed a 3.2 x 2.0 cm stone in the lower pole the right kidney. He presents today for right nephroureteral catheter placement prior to planned nephrolithotomy.  Past Medical History  Diagnosis Date  . Hypertension   . Kidney stones   . Sleep apnea     does not use cpap    Past Surgical History  Procedure Laterality Date  . Renal stents  may 2016    done in [place]    Allergies: Hydrochlorothiazide and Penicillins  Medications: Prior to Admission medications   Medication Sig Start Date End Date Taking? Authorizing Provider  acetaminophen (TYLENOL) 500 MG tablet Take 1,000 mg by mouth every 6 (six) hours as needed for moderate pain.   Yes Historical Provider, MD  carvedilol (COREG) 6.25 MG tablet Take 1 tablet (6.25 mg total) by mouth 2 (two) times daily with a meal. 02/24/14  Yes Reuben Likes, MD  ibuprofen (ADVIL,MOTRIN) 200 MG tablet Take 800 mg by mouth every 6 (six) hours as needed for moderate pain.   Yes Historical Provider, MD  oxyCODONE-acetaminophen (PERCOCET) 5-325 MG per tablet Take 1 tablet by mouth every 4 (four) hours as needed for moderate pain. 03/31/15  Yes Dione Booze, MD  hydrochlorothiazide (HYDRODIURIL) 12.5 MG tablet Take 1 tablet (12.5 mg total) by mouth daily. Patient not taking: Reported on 04/06/2015 02/24/14   Reuben Likes,  MD  lisinopril (PRINIVIL,ZESTRIL) 10 MG tablet Take 10 mg by mouth daily.  10/10/14   Historical Provider, MD  Tamsulosin HCl (FLOMAX) 0.4 MG CAPS Take 1 capsule (0.4 mg total) by mouth daily. Patient not taking: Reported on 10/30/2014 12/16/11   Azalia Bilis, MD     History reviewed. No pertinent family history.  Social History   Social History  . Marital Status: Married    Spouse Name: N/A  . Number of Children: N/A  . Years of Education: N/A   Social History Main Topics  . Smoking status: Former Smoker -- 1.00 packs/day for 15 years    Types: Cigarettes    Quit date: 08/18/2004  . Smokeless tobacco: Never Used  . Alcohol Use: Yes     Comment: occasional few beers every few months  . Drug Use: Yes    Special: Marijuana     Comment: hx of marijuaana use none since 2006  . Sexual Activity: Not Asked   Other Topics Concern  . None   Social History Narrative      Review of Systems  Constitutional: Negative for fever and chills.  Respiratory: Negative for cough and shortness of breath.   Cardiovascular: Negative for chest pain.  Gastrointestinal: Negative for nausea, vomiting and blood in stool.       Mild lower pelvic discomfort  Genitourinary: Positive for dysuria, urgency, frequency, hematuria and flank pain.  Musculoskeletal: Positive for back pain.  Neurological: Negative for headaches.    Vital Signs: BP 160/90 mmHg  Pulse 78  Temp(Src) 98.1 F (36.7 C) (Oral)  Resp  18  Ht  (1.676 m)  Wt 210 lb (95.255 kg)  BMI 33.91 kg/m2  SpO2 98%  Physical Exam  Constitutional: He is oriented to person, place, and time. He appears well-developed and well-nourished.  Cardiovascular: Normal rate and regular rhythm.   Pulmonary/Chest: Effort normal and breath sounds normal.  Abdominal: Soft. Bowel sounds are normal. There is tenderness.  Musculoskeletal: Normal range of motion. He exhibits no edema.  Neurological: He is alert and oriented to person, place, and  time.    Mallampati Score:     Imaging: US Renal  03/31/2015   CLINICAL DATA:  Acute onset of right groin pain. Dysuria and hematuria. Initial encounter.  EXAM: RENAL / URINARY TRACT ULTRASOUND COMPLETE  COMPARISON:  CT of the abdomen and pelvis performed 12/16/2011  FINDINGS: Right Kidney:  Length: 11.8 cm. Echogenicity within normal limits. A 1.3 cm stone is noted at the lower pole of the right kidney. No mass or hydronephrosis visualized.  Left Kidney:  Length: 12.7 cm. Echogenicity within normal limits. A 1.7 cm cyst is noted at the the lower pole of the left kidney. No hydronephrosis visualized.  Bladder:  Appears normal for degree of bladder distention. Several stones and apparent debris are noted at the base of the bladder.  IMPRESSION: 1. No evidence of hydronephrosis. 2. Several stones and apparent debris noted at the base of the bladder. 3. Small left renal cyst noted. 4. 1.3 cm nonobstructing stone at the lower pole of the right kidney.   Electronically Signed   By: Roanna Raider M.D.   On: 03/31/2015 04:18    Labs:  CBC:  Recent Labs  03/31/15 0322 04/20/15 0734  WBC 5.0 5.1  HGB 14.2 14.7  HCT 39.9 40.9  PLT 206 237    COAGS:  Recent Labs  04/20/15 0734  INR 0.95    BMP:  Recent Labs  03/31/15 0322 04/20/15 0734  NA 134* 138  K 3.5 2.8*  CL 102 104  CO2 23 25  GLUCOSE 240* 121*  BUN 17 17  CALCIUM 9.4 9.4  CREATININE 1.01 0.77  GFRNONAA >60 >60  GFRAA >60 >60    LIVER FUNCTION TESTS: No results for input(s): BILITOT, AST, ALT, ALKPHOS, PROT, ALBUMIN in the last 8760 hours.  TUMOR MARKERS: No results for input(s): AFPTM, CEA, CA199, CHROMGRNA in the last 8760 hours.  Assessment and Plan: Eddie Miranda is a 54 y.o. male with history of hypertension, nephrolithiasis, sleep apnea, and recent hospitalization in [place] secondary to sepsis from obstructing right ureteral stone. Patient underwent stent placement at that time and since then has been  having voiding irritative symptoms including right flank pain, intermittent gross hematuria, dysuria, frequency and urgency. He has been treated with antibiotic therapy. Recent KUB has revealed a 3.2 x 2.0 cm stone in the lower pole the right kidney. He presents today for right nephroureteral catheter placement prior to planned nephrolithotomy.Risks and benefits discussed with the patient/wife including, but not limited to infection, bleeding, significant bleeding causing loss or decrease in renal function or damage to adjacent structures. All of the patient's questions were answered, patient is agreeable to proceed.Consent signed and in chart. Patient's potassium is 2.8 today- will discuss replacement options with Dr. Miles Costain. Monitor glucose levels as well while in-house- denies hx of DM.     Thank you for this interesting consult.  I greatly enjoyed meeting Eddie Miranda and look forward to participating in their care.  A copy of this report was sent  to the requesting provider on this date.  Signed: D. Jeananne Rama 04/20/2015, 8:57 AM   I spent a total of 15 minutes in face to face in clinical consultation, greater than 50% of which was counseling/coordinating care for right percutaneous nephrostomy/nephroureteral catheter placement

## 2015-04-20 NOTE — H&P (Signed)
Eddie Miranda is a 54 year old male with a large right renal calculus.   History of Present Illness He was working in [place] and developed sepsis secondary to an obstructing right ureteral stone. He underwent stent placement. He's been having pain and intermittent gross hematuria. 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  His urinalysis on 03/16/15 had numerous white cells but he just finished a course of antibiotics. A urine culture was sent.  He has been having a lot of irritative voiding symptoms including frequency, urgency, nocturia straining to urinate and has been doing this for 3 months because he was in [place] and did not have insurance there. He is anxious to get his stone treated. He has intermittently seen gross hematuria.   Past Medical History Problems  1. History of Anxiety (F41.9) 2. History of depression (Z86.59) 3. History of hypertension (Z86.79) 4. History of sleep apnea (Z87.09) 5. History of urinary stone (Z61.096)  Surgical History Problems  1. History of No Surgical Problems  Current Meds 1. Atrovent HFA 17 MCG/ACT Inhalation Aerosol Solution;  Therapy: (Recorded:01Aug2016) to Recorded 2. Coreg 6.25 MG Oral Tablet;  Therapy: (Recorded:01Aug2016) to Recorded 3. Hydrochlorothiazide 12.5 MG Oral Capsule;  Therapy: (Recorded:01Aug2016) to Recorded 4. Lisinopril 10 MG Oral Tablet;  Therapy: (Recorded:01Aug2016) to Recorded 5. Methocarbamol 750 MG Oral Tablet;  Therapy: (Recorded:01Aug2016) to Recorded 6. Motrin IB 200 MG Oral Tablet;  Therapy: (Recorded:01Aug2016) to Recorded 7. Tamsulosin HCl - 0.4 MG Oral Capsule;  Therapy: (Recorded:01Aug2016) to Recorded 8. TraMADol HCl - 50 MG Oral Tablet;  Therapy: (Recorded:01Aug2016) to Recorded  Allergies Medication  1. Penicillins  Family History Problems  1. Family history of arthritis (Z82.61) : Mother 2. Family history of diabetes mellitus (Z83.3) : Mother 3. Family history of liver cancer (Z80.0) :  Sister 4. Family history of Throat cancer : Father  Social History Problems    Denied: History of Alcohol use   Former smoker [phone])   Married   Occupation  Review of Systems Genitourinary, constitutional, skin, eye, otolaryngeal, hematologic/lymphatic, cardiovascular, pulmonary, endocrine, musculoskeletal, gastrointestinal, neurological and psychiatric system(s) were reviewed and pertinent findings if present are noted and are otherwise negative.  Genitourinary: urinary frequency, feelings of urinary urgency, dysuria, nocturia, difficulty starting the urinary stream, hematuria and initiating urination requires straining.  Gastrointestinal: constipation.  Respiratory: shortness of breath.  Musculoskeletal: back pain.    Vitals Vital Signs  Blood Pressure: 150 / 87 Heart Rate: 71 Recorded: 01Aug2016 10:04AM  Height: 5 ft 6.5 in Weight: 210 lb  BMI Calculated: 33.39 BSA Calculated: 2.05  Physical Exam Constitutional: Well nourished and well developed . No acute distress.   ENT:. The ears and nose are normal in appearance.   Neck: The appearance of the neck is normal and no neck mass is present.   Pulmonary: No respiratory distress and normal respiratory rhythm and effort.   Cardiovascular: Heart rate and rhythm are normal . No peripheral edema.   Abdomen: The abdomen is soft and nontender. No masses are palpated. No CVA tenderness. No hernias are palpable. No hepatosplenomegaly noted.   Lymphatics: The femoral and inguinal nodes are not enlarged or tender.   Skin: Normal skin turgor, no visible rash and no visible skin lesions.   Neuro/Psych:. Mood and affect are appropriate.    Results/Data Urine  COLOR AMBER  APPEARANCE CLOUDY  SPECIFIC GRAVITY 1.025  pH 5.5  GLUCOSE NEGATIVE  BILIRUBIN NEGATIVE  KETONE NEGATIVE  BLOOD 3+  PROTEIN 2+  NITRITE NEGATIVE  LEUKOCYTE  ESTERASE 1+  SQUAMOUS EPITHELIAL/HPF 0-5 HPF WBC 0-5 WBC/HPF RBC >60  RBC/HPF BACTERIA NONE SEEN HPF CRYSTALS NONE SEEN HPF CASTS NONE SEEN LPF Yeast NONE SEEN HPF  Old records or history reviewed: Notes from Dr. Parke Simmers as above.  The following images/tracing/specimen were independently visualized:  KUB as below.  The following clinical lab reports were reviewed:  UA: Urine had red cells but did not appear infected.  Will request old records/history: Results of latest urine culture from Dr. Tedra Sen[place] office.    Assessment   On his KUB today he has a 3.2 x 2.0 cm stone in the lower pole of his right kidney. I don't see any stones along the course of his ureter. The stent is in place and in good position. I didn't see a CT scan in 4/13 that revealed a small stone in the right kidney that had Hounsfield units of 1200.      We discussed the management of urinary stones. These options include observation, ureteroscopy, shockwave lithotripsy, and PCNL. We discussed which options are relevant to these particular stones. We discussed the natural history of stones as well as the complications of untreated stones and the impact on quality of life without treatment as well as with each of the above listed treatments. We also discussed the efficacy of each treatment in its ability to clear the stone burden. With any of these management options I discussed the signs and symptoms of infection and the need for emergent treatment should these be experienced. For each option we discussed the ability of each procedure to clear the patient of their stone burden.    For observation I described the risks which include but are not limited to silent renal damage, life-threatening infection, need for emergent surgery, failure to pass stone, and pain.    For ureteroscopy I described the risks which include heart attack, stroke, pulmonary embolus, death, bleeding, infection, damage to contiguous structures, positioning injury, ureteral stricture, ureteral avulsion, ureteral injury, need  for ureteral stent, inability to perform ureteroscopy, need for an interval procedure, inability to clear stone burden, stent discomfort and pain.    For shockwave lithotripsy I described the risks which include arrhythmia, kidney contusion, kidney hemorrhage, need for transfusion, long-term risk of diabetes or hypertension, back discomfort, flank ecchymosis, flank abrasion, inability to break up stone, inability to pass stone fragments, Steinstrasse, infection associated with obstructing stones, need for different surgical procedure and the need for repeat shockwave lithotripsy.    For PCNL I described the risks including heart attack, sure, pulmonary embolus, death, positioning injury, pneumothorax, hydrothorax, need for chest tube, inability to clear stone burden, renal laceration, arterial venous fistula or malformation, need for embolization of kidney, loss of kidney or renal function, need for repeat procedure, need for prolonged nephrostomy tube, ureteral avulsion and fistula.    Due to the size of the stone we discussed the fact that he would not be a good candidate for lithotripsy or ureteroscopy and I have therefore recommended PCNL.   Plan   He will be scheduled for a right percutaneous nephrolithotomy.

## 2015-04-20 NOTE — Progress Notes (Signed)
Pt requested to take CPAP off to eat.  O2 is 95 on RA.  ETCo2 measuring 39.

## 2015-04-20 NOTE — Anesthesia Postprocedure Evaluation (Signed)
  Anesthesia Post-op Note  Patient: Eddie Miranda  Procedure(s) Performed: Procedure(s) (LRB): RIGHT NEPHROLITHOTOMY PERCUTANEOUS (Right)  Patient Location: PACU  Anesthesia Type: General  Level of Consciousness: awake and alert   Airway and Oxygen Therapy: Patient Spontanous Breathing  Post-op Pain: mild  Post-op Assessment: Post-op Vital signs reviewed, Patient's Cardiovascular Status Stable, Respiratory Function Stable, Patent Airway and No signs of Nausea or vomiting  Last Vitals:  Filed Vitals:   04/20/15 1449  BP: 148/84  Pulse: 75  Temp: 36.4 C  Resp: 12    Post-op Vital Signs: stable   Complications: No apparent anesthesia complications

## 2015-04-20 NOTE — Progress Notes (Signed)
Dr. Acey Lav made aware of patient's condition- needing CPAP on floor- orders given- patient may go to floor

## 2015-04-20 NOTE — Anesthesia Preprocedure Evaluation (Addendum)
Anesthesia Evaluation  Patient identified by MRN, date of birth, ID band Patient awake    Reviewed: Allergy & Precautions, NPO status , Patient's Chart, lab work & pertinent test results  Airway Mallampati: II  TM Distance: >3 FB Neck ROM: Full    Dental no notable dental hx. (+) Partial Upper   Pulmonary sleep apnea , former smoker,  breath sounds clear to auscultation  Pulmonary exam normal       Cardiovascular hypertension, Pt. on medications Normal cardiovascular examRhythm:Regular Rate:Normal     Neuro/Psych negative neurological ROS  negative psych ROS   GI/Hepatic negative GI ROS, Neg liver ROS,   Endo/Other  negative endocrine ROS  Renal/GU negative Renal ROS  negative genitourinary   Musculoskeletal negative musculoskeletal ROS (+)   Abdominal   Peds negative pediatric ROS (+)  Hematology negative hematology ROS (+)   Anesthesia Other Findings   Reproductive/Obstetrics negative OB ROS                          Anesthesia Physical Anesthesia Plan  ASA: II  Anesthesia Plan: General   Post-op Pain Management:    Induction: Intravenous  Airway Management Planned: Oral ETT  Additional Equipment:   Intra-op Plan:   Post-operative Plan: Extubation in OR  Informed Consent: I have reviewed the patients History and Physical, chart, labs and discussed the procedure including the risks, benefits and alternatives for the proposed anesthesia with the patient or authorized representative who has indicated his/her understanding and acceptance.   Dental advisory given  Plan Discussed with: CRNA  Anesthesia Plan Comments:        Anesthesia Quick Evaluation

## 2015-04-20 NOTE — Progress Notes (Signed)
Patient ID: Eddie Miranda, male   DOB: 01/06/1961, 54 y.o.   MRN: [id number] No complaints.   AVSS Flank dsg. dry and intact. Foley draining sl. pink urine, no clots.  Doing well post op. Discussed complete clearance of stone.  Foley out in AM. Anticipate D/C in AM.

## 2015-04-20 NOTE — Progress Notes (Signed)
Nurse secretary called the nurse that patient needed me, RN can't come that fast because RN has ongoing admission. When RN came on the room, patient is very upset about not attending the patient that fast. Then RN accidentally drop the call light and patient curse and yell to the RN. Patient requested a male nurse. RN called the charge nurse and let her know.

## 2015-04-20 NOTE — Op Note (Signed)
PATIENT:  Eddie Miranda  PRE-OPERATIVE DIAGNOSIS: Right renal calculus  POST-OPERATIVE DIAGNOSIS: Same  PROCEDURE: 1. Right percutaneous nephrolithotomy (3 cm) 2. Right ureteroscopy with stent retrieval and removal 3. Right antegrade double-J stent placement  SURGEON:  [place]  INDICATION: Clara Smolen is a 54 year old male who had an obstructing right UPJ stone and underwent stent placement emergently while [place] of [place]. He returned and had a KUB which revealed a 3 cm long stone within the renal pelvic region on the right-hand side with a stent in good position. We discussed the treatment options and he is brought to the operating room today for an elective right percutaneous nephrolithotomy.  ANESTHESIA:  General  EBL:  100 mL  DRAINS: 6 French, 26 cm double-J stent in the right ureter  LOCAL MEDICATIONS USED:  None  SPECIMEN: Stone given to patient   Description of procedure: After informed consent the patient was taken to the operating room and placed on the table in a supine position. General anesthesia was then administered. Once fully anesthetized the patient was moved to the operating room table in the prone position. His right flank as well as his nephrostomy catheter were sterilely prepped and draped. An official timeout was then performed.   I passed a 0.038 inch floppy-tipped guidewire through the nephrostomy catheter down into the bladder under fluoroscopy and remove the nephrostomy catheter. I then passed the peel-away coaxial catheter over the guidewire and down the ureter under fluoroscopy. A transverse incision was made over the site where the guidewire was exiting the flank and a second guidewire was then passed down the inner portion of the peel-away coaxial catheter into the bladder under fluoroscopy. The peel-away catheter was removed and the UroMax nephrostomy dilating balloon was then passed over the working guidewire with the safety guidewire  having been clamped to the drape.  I inflated the UroMax balloon under fluoroscopy after placing it at the level of the renal pelvis and with this in place I passed the 30 French nephrostomy access sheath over the balloon and into the area the renal pelvis under fluoroscopy. I then deflated the balloon and remove this and left the working guidewire in place as well. I then performed rigid nephroscopy.  The 73 French rigid nephroscope was passed through the access sheath and I was able to easily identify the stone. I used the Music therapist to fragment the stone and as the stone began to break into larger pieces I used the 2 prong grasper to grasp these. This was done intermittently until all stone fragments greater than 1 mm had been removed visually. I then looked for the stent that had been previously placed but it was not visible so I decided to first proceed with antegrade ureteroscopy.  Antegrade ureteroscopy was performed by passing the flexible cystoscope through the access sheath and through the UPJ and down the ureter. I could see the stent on fluoroscopy in the proximal ureter and was able to visualize it with the cystoscope. I used the alligator forceps to grasp the tip of the stent and was able to extract it into the access sheath. I then switched to the rigid nephroscope and used a 2 prong graspers to grasp the stent and remove it. I reinspected the renal pelvis and noted no evidence of perforation or residual stones. I saw no stones in the ureter when I was performing the antegrade ureteroscopy. I therefore backloaded the rigid ureteroscope over the guidewire and passed the double-J stent  over the guidewire into the area the bladder and as I removed the guidewire I noted good curl in the bladder. There was also good curl in the area the renal pelvis.   I then removed the access sheath back to the level of the renal parenchyma and noted no significant bleeding. Since there was minimal bleeding  throughout the case I elected to proceed in a tubeless fashion by first measuring the distance from the tip of the scope when placed at the level of the renal parenchyma out to the end of the access sheath. Using this measurement I passed the laparoscopic FloSeal introducer through the access sheath to the previously measured level and began injecting the FloSeal at this location and slowly backed the access sheath and laparoscopic introducer out filling the area of the renal parenchyma and deep portion of the nephrostomy tract with FloSeal. Once the sheath was removed there was no bleeding at the level of the skin and I therefore closed the skin with a running subcuticular 4-0 Monocryl suture. The incision was then sealed with Dermabond and a sterile dressing was applied. The patient was awakened and taken to the recovery room in stable and satisfactory condition. He tolerated the procedure well no intraoperative complications.   PLAN OF CARE: He will be observed overnight with anticipated discharge in the morning.   PATIENT DISPOSITION:  PACU - hemodynamically stable.

## 2015-04-20 NOTE — Transfer of Care (Signed)
Immediate Anesthesia Transfer of Care Note  Patient: Eddie Miranda  Procedure(s) Performed: Procedure(s): RIGHT NEPHROLITHOTOMY PERCUTANEOUS (Right)  Patient Location: PACU  Anesthesia Type:General  Level of Consciousness: awake, alert  and oriented  Airway & Oxygen Therapy: Patient Spontanous Breathing and Patient connected to face mask oxygen  Post-op Assessment: Report given to RN and Post -op Vital signs reviewed and stable  Post vital signs: Reviewed and stable  Last Vitals:  Filed Vitals:   04/20/15 0734  BP: 160/90  Pulse: 78  Temp: 36.7 C  Resp: 18    Complications: No apparent anesthesia complications

## 2015-04-20 NOTE — Progress Notes (Signed)
Cpap settings unknown pt placed on cpap nasal mask 10 cmh20 with 2l o2 bled in. Pt complained pressure was to high dropped pressure to 6 cmh20 pt tolerating cpap now.

## 2015-04-20 NOTE — Anesthesia Procedure Notes (Signed)
Procedure Name: Intubation Date/Time: 04/20/2015 11:11 AM Performed by: Thornell Mule Pre-anesthesia Checklist: Patient identified, Emergency Drugs available, Suction available and Patient being monitored Patient Re-evaluated:Patient Re-evaluated prior to inductionOxygen Delivery Method: Circle System Utilized Preoxygenation: Pre-oxygenation with 100% oxygen Intubation Type: IV induction Ventilation: Mask ventilation without difficulty Laryngoscope Size: Miller and 3 Grade View: Grade I Tube type: Oral Tube size: 7.5 mm Number of attempts: 1 Airway Equipment and Method: Stylet and Oral airway Placement Confirmation: ETT inserted through vocal cords under direct vision,  positive ETCO2 and breath sounds checked- equal and bilateral Secured at: 21 cm Tube secured with: Tape Dental Injury: Teeth and Oropharynx as per pre-operative assessment

## 2015-04-20 NOTE — Procedures (Signed)
Successful RT NEPHROURETERAL ACCESS FOR PNL NO COMP STABLE OR LATER THIS MORNING

## 2015-04-21 ENCOUNTER — Encounter (HOSPITAL_COMMUNITY): Payer: Self-pay | Admitting: Emergency Medicine

## 2015-04-21 ENCOUNTER — Inpatient Hospital Stay (HOSPITAL_COMMUNITY)
Admission: EM | Admit: 2015-04-21 | Discharge: 2015-04-24 | DRG: 660 | Disposition: A | Discharge status: Home / Self Care | Payer: Medicaid Other | Attending: Urology | Admitting: Urology

## 2015-04-21 DIAGNOSIS — I10 Essential (primary) hypertension: Secondary | ICD-10-CM | POA: Diagnosis present

## 2015-04-21 DIAGNOSIS — Z880 Allergy status to penicillin: Secondary | ICD-10-CM

## 2015-04-21 DIAGNOSIS — R1031 Right lower quadrant pain: Secondary | ICD-10-CM | POA: Diagnosis present

## 2015-04-21 DIAGNOSIS — J9811 Atelectasis: Secondary | ICD-10-CM | POA: Diagnosis present

## 2015-04-21 DIAGNOSIS — N390 Urinary tract infection, site not specified: Principal | ICD-10-CM | POA: Diagnosis present

## 2015-04-21 DIAGNOSIS — Z888 Allergy status to other drugs, medicaments and biological substances status: Secondary | ICD-10-CM

## 2015-04-21 DIAGNOSIS — G8918 Other acute postprocedural pain: Secondary | ICD-10-CM | POA: Diagnosis present

## 2015-04-21 DIAGNOSIS — Z87891 Personal history of nicotine dependence: Secondary | ICD-10-CM

## 2015-04-21 DIAGNOSIS — N201 Calculus of ureter: Secondary | ICD-10-CM | POA: Diagnosis present

## 2015-04-21 DIAGNOSIS — Z79899 Other long term (current) drug therapy: Secondary | ICD-10-CM

## 2015-04-21 DIAGNOSIS — G4730 Sleep apnea, unspecified: Secondary | ICD-10-CM | POA: Diagnosis present

## 2015-04-21 LAB — HEMOGLOBIN AND HEMATOCRIT, BLOOD
HCT: 38.3 % — ABNORMAL LOW (ref 39.0–52.0)
Hemoglobin: 13.5 g/dL (ref 13.0–17.0)

## 2015-04-21 MED ORDER — SODIUM CHLORIDE 0.9 % IV BOLUS (SEPSIS)
500.0000 mL | Freq: Once | INTRAVENOUS | Status: AC
  Administered 2015-04-22: 500 mL via INTRAVENOUS

## 2015-04-21 MED ORDER — PHENOL 1.4 % MT LIQD
1.0000 | OROMUCOSAL | Status: DC | PRN
  Administered 2015-04-21 (×2): 1 via OROMUCOSAL
  Filled 2015-04-21: qty 177

## 2015-04-21 MED ORDER — HYDROMORPHONE HCL 1 MG/ML IJ SOLN
1.0000 mg | Freq: Once | INTRAMUSCULAR | Status: AC
  Administered 2015-04-22: 1 mg via INTRAVENOUS
  Filled 2015-04-21: qty 1

## 2015-04-21 NOTE — ED Notes (Signed)
Pt from home c/o right flank pain from percutaneous nephrolithiotomy yesterday. No blood present in urine. Pt repors he was DC this a.m and after taking his pain medication his pain persists.

## 2015-04-21 NOTE — Discharge Instructions (Signed)
Post percutaneous nephrolithotomy and stent  placement instructions   Definitions:  Ureter: The duct that transports urine from the kidney to the bladder. Stent: A plastic hollow tube that is placed into the ureter, from the kidney to the bladder to prevent the ureter from swelling shut.  General instructions:  Despite the fact that only a small skin incision was used, the area around the kidney, ureter and bladder is raw and irritated. The stent is a foreign body which can further irritate the bladder wall. This irritation is manifested by increased frequency of urination, both day and night, and by an increase in the urge to urinate. In some, the urge to urinate is present almost always. Sometimes the urge is strong enough that you may not be able to stop your self from urinating. This can often be controlled with medication but does not occur in everyone. A stent can safely be left in place for 3 months or greater.  You may see some blood in your urine while the stent is in place and a few days afterward. Do not be alarmed, even if the urine is clear for a while. Get off your feet and drink lots of fluids until clearing occurs. If you start to pass clots or don't improve, call us.  Diet:  You may return to your normal diet immediately. Because of the raw surface of your bladder, alcohol, spicy foods, foods high in acid and drinks with caffeine may cause irritation or frequency and should be used in moderation. To keep your urine flowing freely and avoid constipation, drink plenty of fluids during the day (8-10 glasses). Tip: Avoid cranberry juice because it is very acidic.  Activity:  Your physical activity doesn't need to be restricted. However, if you are very active, you may see some blood in the urine. We suggest that you reduce your activity under the circumstances until the bleeding has stopped.  Bowels:  It is important to keep your bowels regular during the postoperative period.  Straining with bowel movements can cause bleeding. A bowel movement every other day is reasonable. Use a mild laxative if needed, such as milk of magnesia 2-3 tablespoons, or 2 Dulcolax tablets. Call if you continue to have problems. If you had been taking narcotics for pain, before, during or after your surgery, you may be constipated. Take a laxative if necessary.  Medication:  You should resume your pre-surgery medications unless told not to. In addition you may be given an antibiotic to prevent or treat infection. Antibiotics are not always necessary. All medication should be taken as prescribed until the bottles are finished unless you are having an unusual reaction to one of the drugs.  Problems you should report to [place]:  a. Fever greater than 101F. b. Heavy bleeding, or clots (see notes above about blood in urine). c. Inability to urinate. d. Drug reactions (hives, rash, nausea, vomiting, diarrhea). e. Severe burning or pain with urination that is not improving. I have reviewed discharge instructions in detail with the patient. They will follow-up with me or their physician as scheduled. My nurse will also be calling the patients as per protocol.

## 2015-04-21 NOTE — Progress Notes (Signed)
Feels OK Vitals OK Trial of void and advance diet and send home Post op discussed Stent discussed- clarified some issues

## 2015-04-22 ENCOUNTER — Emergency Department (HOSPITAL_COMMUNITY): Payer: Medicaid Other

## 2015-04-22 ENCOUNTER — Encounter (HOSPITAL_COMMUNITY): Payer: Self-pay | Admitting: *Deleted

## 2015-04-22 DIAGNOSIS — N201 Calculus of ureter: Secondary | ICD-10-CM | POA: Diagnosis present

## 2015-04-22 DIAGNOSIS — Z880 Allergy status to penicillin: Secondary | ICD-10-CM | POA: Diagnosis not present

## 2015-04-22 DIAGNOSIS — G4730 Sleep apnea, unspecified: Secondary | ICD-10-CM | POA: Diagnosis present

## 2015-04-22 DIAGNOSIS — Z888 Allergy status to other drugs, medicaments and biological substances status: Secondary | ICD-10-CM | POA: Diagnosis not present

## 2015-04-22 DIAGNOSIS — J9811 Atelectasis: Secondary | ICD-10-CM | POA: Diagnosis present

## 2015-04-22 DIAGNOSIS — I10 Essential (primary) hypertension: Secondary | ICD-10-CM | POA: Diagnosis present

## 2015-04-22 DIAGNOSIS — G8918 Other acute postprocedural pain: Secondary | ICD-10-CM | POA: Diagnosis present

## 2015-04-22 DIAGNOSIS — R1031 Right lower quadrant pain: Secondary | ICD-10-CM | POA: Diagnosis present

## 2015-04-22 DIAGNOSIS — Z87891 Personal history of nicotine dependence: Secondary | ICD-10-CM | POA: Diagnosis not present

## 2015-04-22 DIAGNOSIS — Z79899 Other long term (current) drug therapy: Secondary | ICD-10-CM | POA: Diagnosis not present

## 2015-04-22 DIAGNOSIS — N390 Urinary tract infection, site not specified: Secondary | ICD-10-CM | POA: Diagnosis present

## 2015-04-22 LAB — CBC WITH DIFFERENTIAL/PLATELET
Basophils Absolute: 0 10*3/uL (ref 0.0–0.1)
Basophils Relative: 0 % (ref 0–1)
EOS ABS: 0 10*3/uL (ref 0.0–0.7)
EOS PCT: 1 % (ref 0–5)
HCT: 38 % — ABNORMAL LOW (ref 39.0–52.0)
HEMOGLOBIN: 13.3 g/dL (ref 13.0–17.0)
LYMPHS ABS: 1.7 10*3/uL (ref 0.7–4.0)
Lymphocytes Relative: 20 % (ref 12–46)
MCH: 30 pg (ref 26.0–34.0)
MCHC: 35 g/dL (ref 30.0–36.0)
MCV: 85.6 fL (ref 78.0–100.0)
MONO ABS: 0.6 10*3/uL (ref 0.1–1.0)
MONOS PCT: 7 % (ref 3–12)
Neutro Abs: 6.3 10*3/uL (ref 1.7–7.7)
Neutrophils Relative %: 72 % (ref 43–77)
Platelets: 208 10*3/uL (ref 150–400)
RBC: 4.44 MIL/uL (ref 4.22–5.81)
RDW: 12.7 % (ref 11.5–15.5)
WBC: 8.8 10*3/uL (ref 4.0–10.5)

## 2015-04-22 LAB — URINALYSIS, ROUTINE W REFLEX MICROSCOPIC
Bilirubin Urine: NEGATIVE
GLUCOSE, UA: NEGATIVE mg/dL
Ketones, ur: NEGATIVE mg/dL
Nitrite: NEGATIVE
PROTEIN: 100 mg/dL — AB
Specific Gravity, Urine: 1.018 (ref 1.005–1.030)
Urobilinogen, UA: 0.2 mg/dL (ref 0.0–1.0)
pH: 6 (ref 5.0–8.0)

## 2015-04-22 LAB — I-STAT CHEM 8, ED
BUN: 14 mg/dL (ref 6–20)
CALCIUM ION: 1.14 mmol/L (ref 1.12–1.23)
CHLORIDE: 100 mmol/L — AB (ref 101–111)
CREATININE: 1 mg/dL (ref 0.61–1.24)
GLUCOSE: 124 mg/dL — AB (ref 65–99)
HCT: 38 % — ABNORMAL LOW (ref 39.0–52.0)
Hemoglobin: 12.9 g/dL — ABNORMAL LOW (ref 13.0–17.0)
Potassium: 2.9 mmol/L — ABNORMAL LOW (ref 3.5–5.1)
Sodium: 139 mmol/L (ref 135–145)
TCO2: 23 mmol/L (ref 0–100)

## 2015-04-22 LAB — URINE MICROSCOPIC-ADD ON

## 2015-04-22 MED ORDER — HYDROMORPHONE HCL 1 MG/ML IJ SOLN
1.0000 mg | INTRAMUSCULAR | Status: AC | PRN
  Administered 2015-04-22: 1 mg via INTRAVENOUS
  Filled 2015-04-22: qty 1

## 2015-04-22 MED ORDER — PANTOPRAZOLE SODIUM 40 MG PO TBEC
40.0000 mg | DELAYED_RELEASE_TABLET | Freq: Every day | ORAL | Status: DC
  Administered 2015-04-22 – 2015-04-24 (×3): 40 mg via ORAL
  Filled 2015-04-22 (×3): qty 1

## 2015-04-22 MED ORDER — OXYCODONE-ACETAMINOPHEN 5-325 MG PO TABS
1.0000 | ORAL_TABLET | Freq: Four times a day (QID) | ORAL | Status: DC | PRN
  Administered 2015-04-22 – 2015-04-23 (×6): 2 via ORAL
  Administered 2015-04-24: 1 via ORAL
  Administered 2015-04-24: 2 via ORAL
  Administered 2015-04-24: 1 via ORAL
  Filled 2015-04-22: qty 1
  Filled 2015-04-22 (×7): qty 2
  Filled 2015-04-22: qty 1

## 2015-04-22 MED ORDER — MORPHINE SULFATE (PF) 4 MG/ML IV SOLN
4.0000 mg | Freq: Once | INTRAVENOUS | Status: AC
  Administered 2015-04-22: 4 mg via INTRAVENOUS
  Filled 2015-04-22: qty 1

## 2015-04-22 MED ORDER — HYDROMORPHONE HCL 1 MG/ML IJ SOLN
0.5000 mg | Freq: Once | INTRAMUSCULAR | Status: AC
  Administered 2015-04-22: 0.5 mg via INTRAVENOUS
  Filled 2015-04-22: qty 1

## 2015-04-22 MED ORDER — VANCOMYCIN HCL IN DEXTROSE 1-5 GM/200ML-% IV SOLN
1000.0000 mg | Freq: Three times a day (TID) | INTRAVENOUS | Status: DC
  Administered 2015-04-22 – 2015-04-24 (×5): 1000 mg via INTRAVENOUS
  Filled 2015-04-22 (×7): qty 200

## 2015-04-22 MED ORDER — KETOROLAC TROMETHAMINE 30 MG/ML IJ SOLN: 30.0000 mg | Freq: Three times a day (TID) | INTRAMUSCULAR | Status: DC | PRN

## 2015-04-22 MED ORDER — KETOROLAC TROMETHAMINE 30 MG/ML IJ SOLN
30.0000 mg | Freq: Once | INTRAMUSCULAR | Status: AC
  Administered 2015-04-22: 30 mg via INTRAVENOUS
  Filled 2015-04-22: qty 1

## 2015-04-22 MED ORDER — ACETAMINOPHEN 325 MG PO TABS
650.0000 mg | ORAL_TABLET | Freq: Four times a day (QID) | ORAL | Status: DC | PRN
  Administered 2015-04-22 – 2015-04-24 (×3): 650 mg via ORAL
  Filled 2015-04-22 (×3): qty 2

## 2015-04-22 MED ORDER — POTASSIUM CHLORIDE CRYS ER 20 MEQ PO TBCR
40.0000 meq | EXTENDED_RELEASE_TABLET | Freq: Once | ORAL | Status: AC
  Administered 2015-04-22: 40 meq via ORAL
  Filled 2015-04-22: qty 2

## 2015-04-22 MED ORDER — DEXTROSE 5 % IV SOLN
1.0000 g | Freq: Once | INTRAVENOUS | Status: AC
  Administered 2015-04-22: 1 g via INTRAVENOUS
  Filled 2015-04-22: qty 10

## 2015-04-22 MED ORDER — TAMSULOSIN HCL 0.4 MG PO CAPS
0.4000 mg | ORAL_CAPSULE | Freq: Every day | ORAL | Status: DC
  Administered 2015-04-22 – 2015-04-24 (×3): 0.4 mg via ORAL
  Filled 2015-04-22 (×3): qty 1

## 2015-04-22 MED ORDER — ONDANSETRON HCL 4 MG/2ML IJ SOLN: 4.0000 mg | Freq: Three times a day (TID) | INTRAMUSCULAR | Status: AC | PRN

## 2015-04-22 MED ORDER — CARVEDILOL 6.25 MG PO TABS
6.2500 mg | ORAL_TABLET | Freq: Two times a day (BID) | ORAL | Status: DC
  Administered 2015-04-22 – 2015-04-24 (×5): 6.25 mg via ORAL
  Filled 2015-04-22 (×8): qty 1

## 2015-04-22 MED ORDER — PIPERACILLIN-TAZOBACTAM 3.375 G IVPB
3.3750 g | Freq: Three times a day (TID) | INTRAVENOUS | Status: DC
  Administered 2015-04-22 – 2015-04-23 (×5): 3.375 g via INTRAVENOUS
  Filled 2015-04-22 (×7): qty 50

## 2015-04-22 NOTE — Progress Notes (Signed)
Patient with fever. Possibly more of an infectious event. D/c ketorolac. Plan zosyn, vancomycin, blood cultures, urine culture, tylenol, percocet.

## 2015-04-22 NOTE — ED Notes (Signed)
Pt is in to much pain to give a urine sample at this time. Pt is aware that a sample is needed. Pt has urinal at bedside.

## 2015-04-22 NOTE — ED Notes (Signed)
Family at bedside. Wife given coffee and warm blankets

## 2015-04-22 NOTE — Progress Notes (Signed)
ANTIBIOTIC CONSULT NOTE - INITIAL  Pharmacy Consult for Vancomycin, Zosyn Indication: fever, likely urinary source  Allergies  Allergen Reactions  . Hydrochlorothiazide Other (See Comments)    "makes me feel terrible"  . Penicillins     Fainting     Patient Measurements: Height:  (167.6 cm) Weight: 209 lb 7 oz (95 kg) IBW/kg (Calculated) : 63.8  Vital Signs: Temp: 101.3 F (38.5 C) (09/04 1332) Temp Source: Oral (09/04 1332) BP: 153/75 mmHg (09/04 1332) Pulse Rate: 105 (09/04 1332) Intake/Output from previous day:   Intake/Output from this shift: Total I/O In: 240 [P.O.:240] Out: 970 [Urine:970]  Labs:  Recent Labs  04/20/15 0734 04/21/15 0535 04/22/15 0006 04/22/15 0015  WBC 5.1  --  8.8  --   HGB 14.7 13.5 13.3 12.9*  PLT 237  --  208  --   CREATININE 0.77  --   --  1.00   Estimated Creatinine Clearance: 90.1 mL/min (by C-G formula based on Cr of 1). No results for input(s): VANCOTROUGH, VANCOPEAK, VANCORANDOM, GENTTROUGH, GENTPEAK, GENTRANDOM, TOBRATROUGH, TOBRAPEAK, TOBRARND, AMIKACINPEAK, AMIKACINTROU, AMIKACIN in the last 72 hours.   Microbiology: No results found for this or any previous visit (from the past 720 hour(s)).  Medical History: Past Medical History  Diagnosis Date  . Hypertension   . Kidney stones   . Sleep apnea     does not use cpap     Assessment: 54 yoM POD#2 right PCNL returned to hospital with RLQ pain last night.  Received Ceftriaxone x 1 dose in ED and was admitted for pain control and evaluation.  Antibiotics were not continued on admission because patient was afebrile with normal WBC.  Now, temperature 101.3 and MD concerned that this is more of an infectious event and ordered vancomycin and zosyn per pharmacy.  Blood and urine cultures ordered.  SCr 1.0 for estimated CrCl~84 ml/min (normalized).  9/4 >> Ceftriaxone x 1 9/4 >> Vancomycin >> 9/4 >> Zosyn >>  Goal of Therapy:  Vancomycin trough level 15-20 mcg/ml  (higher trough goal until source identified) Doses adjusted per renal function Eradication of infection  Plan:  1.  Vancomycin 1g IV q8h. 2.  Zosyn 3.375g IV q8h (4 hour infusion time). 3.  F/u culture results, SCr, trough levels as indicated, clinical course.  Clance Boll 04/22/2015,2:12 PM

## 2015-04-22 NOTE — H&P (Addendum)
H&P  Chief Complaint: RLQ pain   History of Present Illness: Pt is POD#2 right PCNL. Went home yesteday AM and back last night. Came back to hospital with RLQ pain. On CT no fluid collections, mild hydro, rt stent in good position, 7 mm residual LP fragment(s), somewhat distended bladder. Pt voided in ED 600 ml. Pain improved. Sleeping past few hrs after tx to floor. Voided 180 ml, PVR 20 ml. Vitals and labs stable. I reviewed notes, CT scan images. Chest xray with bilateral basilar atelectasis.   No fever or chills. No dysuria or gross hematuria.     Past Medical History  Diagnosis Date  . Hypertension   . Kidney stones   . Sleep apnea     does not use cpap   Past Surgical History  Procedure Laterality Date  . Renal stents  may 2016    done in [place]    Home Medications:  Prescriptions prior to admission  Medication Sig Dispense Refill Last Dose  . acetaminophen (TYLENOL) 500 MG tablet Take 1,000 mg by mouth every 6 (six) hours as needed for moderate pain.   Past Month at Unknown time  . carvedilol (COREG) 6.25 MG tablet Take 1 tablet (6.25 mg total) by mouth 2 (two) times daily with a meal. 60 tablet 3 04/20/2015 at Unknown time  . ibuprofen (ADVIL,MOTRIN) 200 MG tablet Take 800 mg by mouth every 6 (six) hours as needed for moderate pain.   04/20/2015 at Unknown time  . Oxycodone HCl 10 MG TABS Take 1 tablet (10 mg total) by mouth every 4 (four) hours as needed. 30 tablet 0 04/21/2015 at Unknown time  . hydrochlorothiazide (HYDRODIURIL) 12.5 MG tablet Take 1 tablet (12.5 mg total) by mouth daily. (Patient not taking: Reported on 04/06/2015) 30 tablet 3 03/30/2015 at Unknown time  . lisinopril (PRINIVIL,ZESTRIL) 10 MG tablet Take 10 mg by mouth daily.   2 03/30/2015 at Unknown time  . oxyCODONE-acetaminophen (PERCOCET) 5-325 MG per tablet Take 1 tablet by mouth every 4 (four) hours as needed for moderate pain. (Patient not taking: Reported on 04/21/2015) 20 tablet 0 Past Month at Unknown time   . Tamsulosin HCl (FLOMAX) 0.4 MG CAPS Take 1 capsule (0.4 mg total) by mouth daily. (Patient not taking: Reported on 10/30/2014) 7 capsule 0 Not Taking at Unknown time   Allergies:  Allergies  Allergen Reactions  . Hydrochlorothiazide Other (See Comments)    "makes me feel terrible"  . Penicillins     Fainting     History reviewed. No pertinent family history. Social History:  reports that he quit smoking about 10 years ago. His smoking use included Cigarettes. He has a 15 pack-year smoking history. He has never used smokeless tobacco. He reports that he drinks alcohol. He reports that he uses illicit drugs (Marijuana).  ROS: A complete review of systems was performed.  All systems are negative except for pertinent findings as noted. Review of Systems  All other systems reviewed and are negative.    Physical Exam:  Vital signs in last 24 hours: Temp:  [98.2 F (36.8 C)-98.8 F (37.1 C)] 98.8 F (37.1 C) (09/04 0920) Pulse Rate:  [75-97] 93 (09/04 0920) Resp:  [14-21] 16 (09/04 0920) BP: (132-165)/(61-93) 159/74 mmHg (09/04 0920) SpO2:  [86 %-94 %] 93 % (09/04 0800) General:  Alert and oriented, No acute distress. Resting in bed. Looks comfortable.  HEENT: Normocephalic, atraumatic Neck: No JVD or lymphadenopathy Cardiovascular: Regular rate and rhythm Lungs: Regular rate and  effort Abdomen: Soft, nontender, nondistended, no abdominal masses Back: No CVA tenderness Extremities: No edema Neurologic: Grossly intact  Laboratory Data:  Results for orders placed or performed during the hospital encounter of 04/21/15 (from the past 24 hour(s))  CBC with Differential     Status: Abnormal   Collection Time: 04/22/15 12:06 AM  Result Value Ref Range   WBC 8.8 4.0 - 10.5 K/uL   RBC 4.44 4.22 - 5.81 MIL/uL   Hemoglobin 13.3 13.0 - 17.0 g/dL   HCT 16.1 (L) 09.6 - 04.5 %   MCV 85.6 78.0 - 100.0 fL   MCH 30.0 26.0 - 34.0 pg   MCHC 35.0 30.0 - 36.0 g/dL   RDW 40.9 81.1 - 91.4 %    Platelets 208 150 - 400 K/uL   Neutrophils Relative % 72 43 - 77 %   Neutro Abs 6.3 1.7 - 7.7 K/uL   Lymphocytes Relative 20 12 - 46 %   Lymphs Abs 1.7 0.7 - 4.0 K/uL   Monocytes Relative 7 3 - 12 %   Monocytes Absolute 0.6 0.1 - 1.0 K/uL   Eosinophils Relative 1 0 - 5 %   Eosinophils Absolute 0.0 0.0 - 0.7 K/uL   Basophils Relative 0 0 - 1 %   Basophils Absolute 0.0 0.0 - 0.1 K/uL  I-stat Chem 8, ED     Status: Abnormal   Collection Time: 04/22/15 12:15 AM  Result Value Ref Range   Sodium 139 135 - 145 mmol/L   Potassium 2.9 (L) 3.5 - 5.1 mmol/L   Chloride 100 (L) 101 - 111 mmol/L   BUN 14 6 - 20 mg/dL   Creatinine, Ser 7.82 0.61 - 1.24 mg/dL   Glucose, Bld 956 (H) 65 - 99 mg/dL   Calcium, Ion 2.13 0.86 - 1.23 mmol/L   TCO2 23 0 - 100 mmol/L   Hemoglobin 12.9 (L) 13.0 - 17.0 g/dL   HCT 57.8 (L) 46.9 - 62.9 %  Urinalysis, Routine w reflex microscopic (not at [facility])     Status: Abnormal   Collection Time: 04/22/15  2:02 AM  Result Value Ref Range   Color, Urine YELLOW YELLOW   APPearance CLOUDY (A) CLEAR   Specific Gravity, Urine 1.018 1.005 - 1.030   pH 6.0 5.0 - 8.0   Glucose, UA NEGATIVE NEGATIVE mg/dL   Hgb urine dipstick LARGE (A) NEGATIVE   Bilirubin Urine NEGATIVE NEGATIVE   Ketones, ur NEGATIVE NEGATIVE mg/dL   Protein, ur 528 (A) NEGATIVE mg/dL   Urobilinogen, UA 0.2 0.0 - 1.0 mg/dL   Nitrite NEGATIVE NEGATIVE   Leukocytes, UA SMALL (A) NEGATIVE  Urine microscopic-add on     Status: Abnormal   Collection Time: 04/22/15  2:02 AM  Result Value Ref Range   Squamous Epithelial / LPF RARE RARE   WBC, UA 21-50 <3 WBC/hpf   RBC / HPF TOO NUMEROUS TO COUNT <3 RBC/hpf   Crystals CA OXALATE CRYSTALS (A) NEGATIVE   No results found for this or any previous visit (from the past 240 hour(s)). Creatinine:  Recent Labs  04/20/15 0734 04/22/15 0015  CREATININE 0.77 1.00    Impression/Assessment/plan: -start protonix, toradol prn for pain. Start tamsulosin for stent  pain. Plan d/c D/c with hydromorphone if pain improves. -SCD's -Incentive spirometry encouraged -may need second look URS depending on KUB in office at follow-up, need to keep stent.    Thomasene Dubow 04/22/2015, 11:25 AM

## 2015-04-22 NOTE — ED Provider Notes (Addendum)
CSN: [id number]     Arrival date & time 04/21/15  2248 History   First MD Initiated Contact with Patient 04/21/15 2317     Chief Complaint  Patient presents with  . Post-op Problem     (Consider location/radiation/quality/duration/timing/severity/associated sxs/prior Treatment) HPI Comments: The patient presents with his wife and complains of severe RLQ abdominal discomfort that has been present since recent urologic procedure. He underwent procedure to remove a right ureteral stone by Dr. Vernie Ammons on 04/20/15 and went home from the hospital in improved condition, doing well. After he arrived at home he started having increased pain that has progressed to severe pain and is unaffected by home medications (Percocet). No fever. He has had nausea. No hematuria.   The history is provided by the patient. No language interpreter was used.    Past Medical History  Diagnosis Date  . Hypertension   . Kidney stones   . Sleep apnea     does not use cpap   Past Surgical History  Procedure Laterality Date  . Renal stents  may 2016    done in [place]   No family history on file. Social History  Substance Use Topics  . Smoking status: Former Smoker -- 1.00 packs/day for 15 years    Types: Cigarettes    Quit date: 08/18/2004  . Smokeless tobacco: Never Used  . Alcohol Use: Yes     Comment: occasional few beers every few months    Review of Systems  Constitutional: Negative for fever.  Respiratory: Negative.   Cardiovascular: Negative.   Gastrointestinal: Positive for nausea and abdominal pain.  Genitourinary: Negative for hematuria and flank pain.  Musculoskeletal: Negative for back pain.  Neurological: Negative for light-headedness.      Allergies  Hydrochlorothiazide and Penicillins  Home Medications   Prior to Admission medications   Medication Sig Start Date End Date Taking? Authorizing Provider  acetaminophen (TYLENOL) 500 MG tablet Take 1,000 mg by mouth every 6 (six) hours  as needed for moderate pain.   Yes Historical Provider, MD  carvedilol (COREG) 6.25 MG tablet Take 1 tablet (6.25 mg total) by mouth 2 (two) times daily with a meal. 02/24/14  Yes Reuben Likes, MD  ibuprofen (ADVIL,MOTRIN) 200 MG tablet Take 800 mg by mouth every 6 (six) hours as needed for moderate pain.   Yes Historical Provider, MD  Oxycodone HCl 10 MG TABS Take 1 tablet (10 mg total) by mouth every 4 (four) hours as needed. 04/20/15  Yes Ihor Gully, MD  hydrochlorothiazide (HYDRODIURIL) 12.5 MG tablet Take 1 tablet (12.5 mg total) by mouth daily. Patient not taking: Reported on 04/06/2015 02/24/14   Reuben Likes, MD  lisinopril (PRINIVIL,ZESTRIL) 10 MG tablet Take 10 mg by mouth daily.  10/10/14   Historical Provider, MD  oxyCODONE-acetaminophen (PERCOCET) 5-325 MG per tablet Take 1 tablet by mouth every 4 (four) hours as needed for moderate pain. Patient not taking: Reported on 04/21/2015 03/31/15   Dione Booze, MD  Tamsulosin HCl (FLOMAX) 0.4 MG CAPS Take 1 capsule (0.4 mg total) by mouth daily. Patient not taking: Reported on 10/30/2014 12/16/11   Azalia Bilis, MD   BP 155/74 mmHg  Pulse 97  Temp(Src) 98.2 F (36.8 C) (Oral)  Resp 14  SpO2 90% Physical Exam  Constitutional: He is oriented to person, place, and time. He appears well-developed and well-nourished.  HENT:  Head: Normocephalic.  Neck: Normal range of motion. Neck supple.  Cardiovascular: Normal rate and regular rhythm.  Pulmonary/Chest: Effort normal and breath sounds normal.  Abdominal: Soft. Bowel sounds are normal. There is tenderness. There is no rebound and no guarding.  Right lower quadrant tender to palpation.  Genitourinary:  No right CVA tenderness. Post-operative bandage in place without surround redness or focal tenderness. Bandage was not removed.   Musculoskeletal: Normal range of motion.  Neurological: He is alert and oriented to person, place, and time.  Skin: Skin is warm and dry. No rash noted.   Psychiatric: He has a normal mood and affect.    ED Course  Procedures (including critical care time) Labs Review Labs Reviewed  CBC WITH DIFFERENTIAL/PLATELET - Abnormal; Notable for the following:    HCT 38.0 (*)    All other components within normal limits  URINALYSIS, ROUTINE W REFLEX MICROSCOPIC (NOT AT [facility]) - Abnormal; Notable for the following:    APPearance CLOUDY (*)    Hgb urine dipstick LARGE (*)    Protein, ur 100 (*)    Leukocytes, UA SMALL (*)    All other components within normal limits  URINE MICROSCOPIC-ADD ON - Abnormal; Notable for the following:    Crystals CA OXALATE CRYSTALS (*)    All other components within normal limits  I-STAT CHEM 8, ED - Abnormal; Notable for the following:    Potassium 2.9 (*)    Chloride 100 (*)    Glucose, Bld 124 (*)    Hemoglobin 12.9 (*)    HCT 38.0 (*)    All other components within normal limits    Imaging Review Dg Chest 2 View  04/22/2015   CLINICAL DATA:  Hypoxia. Postoperative pain from percutaneous nephrolithotomy.  EXAM: CHEST  2 VIEW  COMPARISON:  12/17/2006  FINDINGS: Patchy and leaning basilar opacities are present bilaterally, right greater than left, atelectasis versus infectious infiltrate. No effusions. Pulmonary vasculature is normal.  IMPRESSION: Basilar opacities due to atelectasis or infectious infiltrate.   Electronically Signed   By: Ellery Plunk M.D.   On: 04/22/2015 03:40   Dg C-arm 1-60 Min-no Report  04/20/2015   CLINICAL DATA: surgery   C-ARM 1-60 MINUTES  Fluoroscopy was utilized by the requesting physician.  No radiographic  interpretation.    Ct Renal Stone Study  04/22/2015   CLINICAL DATA:  Right flank pain. Percutaneous nephrolithotomy yesterday.  EXAM: CT ABDOMEN AND PELVIS WITHOUT CONTRAST  TECHNIQUE: Multidetector CT imaging of the abdomen and pelvis was performed following the standard protocol without IV contrast.  COMPARISON:  12/16/2011.  FINDINGS: There is a satisfactorily positioned  right ureteral stent. There are 3 lower pole right collecting system calculus fragments, the largest measuring 5 x 7 mm. The others measure 2-4 mm. Moderate hydronephrosis is present on the right. There is moderate perinephric stranding. There is no focal parenchymal abnormality about either kidney. There is no perinephric hematoma or drainable fluid collection.  There are unremarkable unenhanced appearances of the liver, gallbladder, pancreas, spleen, adrenals. There is a 2 mm upper pole left collecting system calculus.  The abdominal aorta is normal in caliber. There is mild atherosclerotic calcification. There is no adenopathy in the abdomen or pelvis.  There are normal appearances of the stomach, small bowel and colon. The appendix is normal.  There is no significant musculoskeletal lesion.  IMPRESSION: 1. Satisfactorily positioned right ureteral stent. 2. Lower pole right collecting system calculi measuring up to 5 x 7 mm. Upper pole left nephrolithiasis. 3. Moderate right hydronephrosis and perinephric stranding opacities. No perinephric hematoma or other significant complication.  Electronically Signed   By: Ellery Plunk M.D.   On: 04/22/2015 02:15   Ir Ureteral Stent Right New Access W/o Sep Nephrostomy Cath  04/20/2015   CLINICAL DATA:  Right renal calculus, access for nephrolithotomy later today  EXAM: ULTRASOUND FLUOROSCOPIC RIGHT NEPHRO URETERAL CATHETER ACCESS  Date:  9/2/20169/09/2014 10:20 am  Radiologist:  M. Ruel Favors, MD  Guidance:  Ultrasound and fluoroscopic  FLUOROSCOPY TIME:  3 minutes 12 seconds, 92 mGy  MEDICATIONS AND MEDICAL HISTORY: 4 mg Cipro administered within 1 hour of the procedure, 2 mg Versed, 75 mcg fentanyl  ANESTHESIA/SEDATION: 15 minutes  CONTRAST:  10mL OMNIPAQUE IOHEXOL 300 MG/ML  SOLN  COMPLICATIONS: None immediate  PROCEDURE: Informed consent was obtained from the patient following explanation of the procedure, risks, benefits and alternatives. The patient  understands, agrees and consents for the procedure. All questions were addressed. A time out was performed.  Maximal barrier sterile technique utilized including caps, mask, sterile gowns, sterile gloves, large sterile drape, hand hygiene, and Betadine.  Previous imaging reviewed. Patient positioned prone. Preliminary ultrasound performed. Mild hydronephrosis noted. Echogenic shadowing renal pelvis calculus evident.  Under sterile conditions and local anesthesia, an 18 gauge percutaneous needle was advanced into a posterior lower pole calyx. Needle position confirmed with ultrasound. The stone was felt on the needle tip. Position confirmed with fluoroscopy. There was return of blood-tinged urine. 018 guidewire advanced followed by the Accustick dilator set. Contrast injection confirms intrarenal position. Images obtained for documentation. Bentson guidewire inserted. A 5 French MPA catheter was advanced to access the ureter. Access was advanced down the ureter into the bladder. Four French glide cath remains within the bladder to serve as nephro ureteral access for nephrolithotomy later today. Ureteral stent remains. Access secured with a Prolene suture and sterile dressing applied. No immediate complication. Patient tolerated the procedure well.  IMPRESSION: Successful ultrasound and fluoroscopic right nephro ureteral catheter access prior to operative nephrolithotomy   Electronically Signed   By: Judie Petit.  Shick M.D.   On: 04/20/2015 11:41   I have personally reviewed and evaluated these images and lab results as part of my medical decision-making.   EKG Interpretation None      MDM   Final diagnoses:  Post-op pain     He has been desaturating since receiving IV narcotic pain medications (total 1.5 mg Dilaudid). Pain is still uncontrolled. He denies SOB. He has a history of sleep apnea and, per wife, had low oxygen saturations following surgery with rapid recovery. CXR ordered.  Patient has evidence  UTI - Rocephin provided. Discussed with Dr. Sherron Monday. Will observe the patient over 2 hours, provide Toradol per Dr. Sherron Monday and, if pain uncontrolled, he will admit to their service.   Re-evaluate: Pain is improved after Toradol, "4 on pain scale of 10" (down from 9). Oxygenation is improved. VS stable otherwise. 0.5 mg Dilaudid ordered. Will re-evaluate.   6:30 - pain improved but still present. The patient states he is not comfortable enough to go home. Will call Dr. Sherron Monday for admission for pain control.   Elpidio Anis, PA-C 04/22/15 0557  Jerelyn Scott, MD 04/22/15 0600  Elpidio Anis, PA-C 04/22/15 4098  Jerelyn Scott, MD 04/22/15 [id number]

## 2015-04-23 LAB — CBC WITH DIFFERENTIAL/PLATELET
Basophils Absolute: 0 10*3/uL (ref 0.0–0.1)
Basophils Relative: 0 % (ref 0–1)
Eosinophils Absolute: 0 10*3/uL (ref 0.0–0.7)
Eosinophils Relative: 0 % (ref 0–5)
HEMATOCRIT: 36.6 % — AB (ref 39.0–52.0)
HEMOGLOBIN: 12.6 g/dL — AB (ref 13.0–17.0)
LYMPHS ABS: 1.6 10*3/uL (ref 0.7–4.0)
LYMPHS PCT: 21 % (ref 12–46)
MCH: 30 pg (ref 26.0–34.0)
MCHC: 34.4 g/dL (ref 30.0–36.0)
MCV: 87.1 fL (ref 78.0–100.0)
MONO ABS: 0.6 10*3/uL (ref 0.1–1.0)
MONOS PCT: 8 % (ref 3–12)
NEUTROS ABS: 5.1 10*3/uL (ref 1.7–7.7)
NEUTROS PCT: 71 % (ref 43–77)
Platelets: 182 10*3/uL (ref 150–400)
RBC: 4.2 MIL/uL — ABNORMAL LOW (ref 4.22–5.81)
RDW: 12.6 % (ref 11.5–15.5)
WBC: 7.3 10*3/uL (ref 4.0–10.5)

## 2015-04-23 LAB — BASIC METABOLIC PANEL
Anion gap: 7 (ref 5–15)
BUN: 16 mg/dL (ref 6–20)
CHLORIDE: 100 mmol/L — AB (ref 101–111)
CO2: 29 mmol/L (ref 22–32)
CREATININE: 1.24 mg/dL (ref 0.61–1.24)
Calcium: 8.4 mg/dL — ABNORMAL LOW (ref 8.9–10.3)
GFR calc non Af Amer: >60 mL/min (ref >60)
GLUCOSE: 131 mg/dL — AB (ref 65–99)
Potassium: 3.3 mmol/L — ABNORMAL LOW (ref 3.5–5.1)
Sodium: 136 mmol/L (ref 135–145)

## 2015-04-23 LAB — URINE CULTURE
Culture: NO GROWTH
Special Requests: NORMAL

## 2015-04-23 MED ORDER — SODIUM CHLORIDE 0.9 % IV SOLN
INTRAVENOUS | Status: DC
  Administered 2015-04-23 (×2): via INTRAVENOUS

## 2015-04-23 NOTE — Progress Notes (Signed)
Feeling better today. Pain improved. Voiding well.   Filed Vitals:   04/23/15 0630  BP: 122/67  Pulse: 82  Temp: 98.2 F (36.8 C)  Resp: 20    Intake/Output Summary (Last 24 hours) at 04/23/15 1303 Last data filed at 04/23/15 1100  Gross per 24 hour  Intake   1350 ml  Output   1390 ml  Net    -40 ml   PE: NAD Working on computer Ext - no calf swelling or pain GU: urine clear  POD#3 right PCNL - improving. Urine Cx pending. Maybe home tomorrow if Cx negative and pain continues to improve. I encouraged to do IS and ambulate in halls.

## 2015-04-24 ENCOUNTER — Encounter (HOSPITAL_COMMUNITY): Payer: Self-pay | Admitting: Urology

## 2015-04-24 DIAGNOSIS — J9811 Atelectasis: Secondary | ICD-10-CM | POA: Diagnosis present

## 2015-04-24 MED ORDER — SODIUM CHLORIDE 0.9 % IV BOLUS (SEPSIS)
500.0000 mL | Freq: Once | INTRAVENOUS | Status: AC
  Administered 2015-04-24: 500 mL via INTRAVENOUS

## 2015-04-24 MED ORDER — HYDROMORPHONE HCL 4 MG PO TABS
4.0000 mg | ORAL_TABLET | ORAL | Status: DC | PRN
Start: 2015-04-24 — End: 2015-04-30

## 2015-04-24 NOTE — Discharge Summary (Signed)
Physician Discharge Summary  Patient ID: Eddie Miranda MRN: [id number] DOB/AGE: 1961/08/14 54 y.o.  Admit date: 04/21/2015 Discharge date: 04/24/2015  Admission Diagnoses: 1. Left lower quadrant pain 2.possible UTI  Discharge Diagnoses:  Principal Problem:   Atelectasis   Discharged Condition: good  Hospital Course: he underwent a left PCNL 2 days prior to his readmission area and he was discharged home in good condition. He had no evidence of infection preoperatively and he was covered with perioperative antibiotics. He presented to the ER with left lower quadrant pain and was admitted for pain control and a "UTI". Throughout his hospitalization he maintained a normal white blood cell count with no elevation. His admission urinalysis did have some white cells as to be expected with a recent surgery and stent in place and did not appear to be infected. He did have fever to 101 but his chest x-ray revealed the likely source as atelectasis. He has remained afebrile area he was maintained on intravenous broad-spectrum antibiotics however his blood cultures 2 as well as urine culture have returned no growth indicating he did not have an infection. He has had some pain and a CT scan was obtained which revealed no evidence of perinephric bleeding with a stent in good position. There were residual stone fragments present. His pain has improved through his hospitalization and is likely secondary to the presence of his stent and recent surgery. With all cultures negative, no fever, a normal white blood cell count and his pain improving he is felt ready for discharge at this time.   Discharge Exam: Blood pressure 141/93, pulse 73, temperature 98.7 F (37.1 C), temperature source Oral, resp. rate 20, height 5\' 6"  (1.676 m), weight 95 kg (209 lb 7 oz), SpO2 91 %. General appearance: alert, cooperative and no distress  His abdomen is soft and nontender. A flank reveals no evidence of ecchymoses or  induration.  Disposition: 01-Home or Self Care  Discharge Instructions    Discharge patient    Complete by:  As directed             Medication List    STOP taking these medications        lisinopril 10 MG tablet  Commonly known as:  PRINIVIL,ZESTRIL      TAKE these medications        acetaminophen 500 MG tablet  Commonly known as:  TYLENOL  Take 1,000 mg by mouth every 6 (six) hours as needed for moderate pain.     carvedilol 6.25 MG tablet  Commonly known as:  COREG  Take 1 tablet (6.25 mg total) by mouth 2 (two) times daily with a meal.     hydrochlorothiazide 12.5 MG tablet  Commonly known as:  HYDRODIURIL  Take 1 tablet (12.5 mg total) by mouth daily.     HYDROmorphone 4 MG tablet  Commonly known as:  DILAUDID  Take 1 tablet (4 mg total) by mouth every 4 (four) hours as needed.     ibuprofen 200 MG tablet  Commonly known as:  ADVIL,MOTRIN  Take 800 mg by mouth every 6 (six) hours as needed for moderate pain.     Oxycodone HCl 10 MG Tabs  Take 1 tablet (10 mg total) by mouth every 4 (four) hours as needed.     oxyCODONE-acetaminophen 5-325 MG per tablet  Commonly known as:  PERCOCET  Take 1 tablet by mouth every 4 (four) hours as needed for moderate pain.     tamsulosin 0.4 MG Caps capsule  Commonly known as:  FLOMAX  Take 1 capsule (0.4 mg total) by mouth daily.           Follow-up Information    Call Garnett Farm, MD.   Specialty:  Urology   Why:  For an appointment in 1-2 weeks when you get home.   Contact information:   [address] [place] [address] [phone]       Signed: Garnett Farm 04/24/2015, 7:06 AM

## 2015-04-24 NOTE — Progress Notes (Addendum)
Pt stated that he felt very dizzy and weak while ambulating in hall. Got patient back into room and took vitals signs.  Vitals were WNL and not orthstatic.  MD made aware of patient complaints.  Per MD give 500cc bolus.  After bolus walked with patient in the hall.  Patient stated he felt better and was steady on his feet.  MD made aware.  Ok for discharge.  Educated patient on importance of taking miralax to help have a BM per MD.

## 2015-04-24 NOTE — Progress Notes (Addendum)
Went over discharge paperwork with patient and family.  Explained importance of taking medications as prescribed.  All questions answered.  .   Patient will be wheeled out by NT after eating breakfast.

## 2015-04-26 ENCOUNTER — Other Ambulatory Visit: Payer: Self-pay | Admitting: Urology

## 2015-04-27 ENCOUNTER — Encounter (HOSPITAL_BASED_OUTPATIENT_CLINIC_OR_DEPARTMENT_OTHER): Payer: Self-pay | Admitting: *Deleted

## 2015-04-27 LAB — CULTURE, BLOOD (ROUTINE X 2)
Culture: NO GROWTH
Culture: NO GROWTH

## 2015-04-30 ENCOUNTER — Encounter (HOSPITAL_BASED_OUTPATIENT_CLINIC_OR_DEPARTMENT_OTHER): Payer: Self-pay | Admitting: *Deleted

## 2015-04-30 NOTE — Progress Notes (Addendum)
NPO AFTER MN.  ARRIVE AT 0600. NEES KUB.  CURRENT LAB RESULTS AND EKG IN CHART AND EPIC. WILL TAKE COREG AM DOS W/ SIPS  OF WATER.

## 2015-05-04 ENCOUNTER — Ambulatory Visit (HOSPITAL_BASED_OUTPATIENT_CLINIC_OR_DEPARTMENT_OTHER)
Admission: RE | Admit: 2015-05-04 | Discharge: 2015-05-04 | Disposition: A | Discharge status: Home / Self Care | Payer: Medicaid Other | Source: Ambulatory Visit | Attending: Urology | Admitting: Urology

## 2015-05-04 ENCOUNTER — Ambulatory Visit (HOSPITAL_COMMUNITY): Payer: Medicaid Other

## 2015-05-04 ENCOUNTER — Encounter (HOSPITAL_BASED_OUTPATIENT_CLINIC_OR_DEPARTMENT_OTHER)
Admission: RE | Disposition: A | Discharge status: Home / Self Care | Payer: Self-pay | Source: Ambulatory Visit | Attending: Urology

## 2015-05-04 ENCOUNTER — Ambulatory Visit (HOSPITAL_BASED_OUTPATIENT_CLINIC_OR_DEPARTMENT_OTHER): Payer: Medicaid Other | Admitting: Anesthesiology

## 2015-05-04 ENCOUNTER — Encounter (HOSPITAL_BASED_OUTPATIENT_CLINIC_OR_DEPARTMENT_OTHER): Payer: Self-pay

## 2015-05-04 DIAGNOSIS — I10 Essential (primary) hypertension: Secondary | ICD-10-CM | POA: Diagnosis not present

## 2015-05-04 DIAGNOSIS — Z79899 Other long term (current) drug therapy: Secondary | ICD-10-CM | POA: Diagnosis not present

## 2015-05-04 DIAGNOSIS — G4730 Sleep apnea, unspecified: Secondary | ICD-10-CM | POA: Insufficient documentation

## 2015-05-04 DIAGNOSIS — Z87891 Personal history of nicotine dependence: Secondary | ICD-10-CM | POA: Insufficient documentation

## 2015-05-04 DIAGNOSIS — N200 Calculus of kidney: Secondary | ICD-10-CM | POA: Insufficient documentation

## 2015-05-04 DIAGNOSIS — Z87442 Personal history of urinary calculi: Secondary | ICD-10-CM | POA: Insufficient documentation

## 2015-05-04 DIAGNOSIS — Z791 Long term (current) use of non-steroidal anti-inflammatories (NSAID): Secondary | ICD-10-CM | POA: Diagnosis not present

## 2015-05-04 HISTORY — PX: HOLMIUM LASER APPLICATION: SHX5852

## 2015-05-04 HISTORY — DX: Depression, unspecified: F32.A

## 2015-05-04 HISTORY — PX: CYSTOSCOPY WITH URETEROSCOPY AND STENT PLACEMENT: SHX6377

## 2015-05-04 HISTORY — DX: Personal history of other infectious and parasitic diseases: Z86.19

## 2015-05-04 HISTORY — DX: Obstructive sleep apnea (adult) (pediatric): G47.33

## 2015-05-04 HISTORY — DX: Personal history of urinary calculi: Z87.442

## 2015-05-04 HISTORY — DX: Major depressive disorder, single episode, unspecified: F32.9

## 2015-05-04 HISTORY — DX: Anxiety disorder, unspecified: F41.9

## 2015-05-04 HISTORY — DX: Calculus of kidney: N20.0

## 2015-05-04 SURGERY — CYSTOURETEROSCOPY, WITH STENT INSERTION
Anesthesia: General | Laterality: Right

## 2015-05-04 MED ORDER — FENTANYL CITRATE (PF) 100 MCG/2ML IJ SOLN
INTRAMUSCULAR | Status: AC
  Filled 2015-05-04: qty 4

## 2015-05-04 MED ORDER — MIDAZOLAM HCL 5 MG/5ML IJ SOLN
INTRAMUSCULAR | Status: DC | PRN
  Administered 2015-05-04: 2 mg via INTRAVENOUS

## 2015-05-04 MED ORDER — MIDAZOLAM HCL 2 MG/2ML IJ SOLN
INTRAMUSCULAR | Status: AC
  Filled 2015-05-04: qty 2

## 2015-05-04 MED ORDER — PROMETHAZINE HCL 25 MG/ML IJ SOLN
6.2500 mg | INTRAMUSCULAR | Status: DC | PRN
  Filled 2015-05-04: qty 1

## 2015-05-04 MED ORDER — ACETAMINOPHEN 10 MG/ML IV SOLN
INTRAVENOUS | Status: DC | PRN
  Administered 2015-05-04: 1000 mg via INTRAVENOUS

## 2015-05-04 MED ORDER — LIDOCAINE HCL (CARDIAC) 20 MG/ML IV SOLN
INTRAVENOUS | Status: DC | PRN
  Administered 2015-05-04: 100 mg via INTRAVENOUS

## 2015-05-04 MED ORDER — SODIUM CHLORIDE 0.9 % IR SOLN
Status: DC | PRN
Start: 2015-05-04 — End: 2015-05-04
  Administered 2015-05-04: 4000 mL

## 2015-05-04 MED ORDER — PHENAZOPYRIDINE HCL 200 MG PO TABS: 200.0000 mg | ORAL_TABLET | Freq: Three times a day (TID) | ORAL | Status: AC | PRN

## 2015-05-04 MED ORDER — DEXAMETHASONE SODIUM PHOSPHATE 4 MG/ML IJ SOLN
INTRAMUSCULAR | Status: DC | PRN
  Administered 2015-05-04: 10 mg via INTRAVENOUS

## 2015-05-04 MED ORDER — KETOROLAC TROMETHAMINE 30 MG/ML IJ SOLN
INTRAMUSCULAR | Status: DC | PRN
  Administered 2015-05-04: 30 mg via INTRAVENOUS

## 2015-05-04 MED ORDER — TAMSULOSIN HCL 0.4 MG PO CAPS
ORAL_CAPSULE | ORAL | Status: AC
  Filled 2015-05-04: qty 1

## 2015-05-04 MED ORDER — PHENAZOPYRIDINE HCL 100 MG PO TABS
ORAL_TABLET | ORAL | Status: AC
  Filled 2015-05-04: qty 2

## 2015-05-04 MED ORDER — LACTATED RINGERS IV SOLN
INTRAVENOUS | Status: DC
  Administered 2015-05-04 (×2): via INTRAVENOUS
  Filled 2015-05-04: qty 1000

## 2015-05-04 MED ORDER — FENTANYL CITRATE (PF) 100 MCG/2ML IJ SOLN
INTRAMUSCULAR | Status: DC | PRN
  Administered 2015-05-04: 50 ug via INTRAVENOUS
  Administered 2015-05-04 (×2): 25 ug via INTRAVENOUS

## 2015-05-04 MED ORDER — PHENAZOPYRIDINE HCL 200 MG PO TABS
200.0000 mg | ORAL_TABLET | Freq: Once | ORAL | Status: AC
  Administered 2015-05-04: 200 mg via ORAL
  Filled 2015-05-04: qty 1

## 2015-05-04 MED ORDER — PROPOFOL 10 MG/ML IV BOLUS
INTRAVENOUS | Status: DC | PRN
  Administered 2015-05-04: 200 mg via INTRAVENOUS
  Administered 2015-05-04: 30 mg via INTRAVENOUS

## 2015-05-04 MED ORDER — CIPROFLOXACIN IN D5W 400 MG/200ML IV SOLN
400.0000 mg | INTRAVENOUS | Status: AC
  Administered 2015-05-04: 400 mg via INTRAVENOUS
  Filled 2015-05-04: qty 200

## 2015-05-04 MED ORDER — HYDROCODONE-ACETAMINOPHEN 10-325 MG PO TABS: 1.0000 | ORAL_TABLET | ORAL | Status: AC | PRN

## 2015-05-04 MED ORDER — ONDANSETRON HCL 4 MG/2ML IJ SOLN
INTRAMUSCULAR | Status: DC | PRN
  Administered 2015-05-04: 4 mg via INTRAVENOUS

## 2015-05-04 MED ORDER — FENTANYL CITRATE (PF) 100 MCG/2ML IJ SOLN
25.0000 ug | INTRAMUSCULAR | Status: DC | PRN
  Filled 2015-05-04: qty 1

## 2015-05-04 MED ORDER — CIPROFLOXACIN IN D5W 400 MG/200ML IV SOLN
INTRAVENOUS | Status: AC
  Filled 2015-05-04: qty 200

## 2015-05-04 MED ORDER — TAMSULOSIN HCL 0.4 MG PO CAPS
0.4000 mg | ORAL_CAPSULE | Freq: Once | ORAL | Status: AC
  Administered 2015-05-04: 0.4 mg via ORAL
  Filled 2015-05-04: qty 1

## 2015-05-04 SURGICAL SUPPLY — 33 items
ADAPTER CATH URET PLST 4-6FR (CATHETERS) IMPLANT
BAG DRAIN URO-CYSTO SKYTR STRL (DRAIN) ×2 IMPLANT
BASKET LASER NITINOL 1.9FR (BASKET) IMPLANT
BASKET STNLS GEMINI 4WIRE 3FR (BASKET) IMPLANT
BASKET STONE 1.7 NGAGE (UROLOGICAL SUPPLIES) ×2 IMPLANT
BASKET ZERO TIP NITINOL 2.4FR (BASKET) IMPLANT
CANISTER SUCT LVC 12 LTR MEDI- (MISCELLANEOUS) IMPLANT
CATH INTERMIT  6FR 70CM (CATHETERS) IMPLANT
CATH URET 5FR 28IN CONE TIP (BALLOONS)
CATH URET 5FR 70CM CONE TIP (BALLOONS) IMPLANT
CLOTH BEACON ORANGE TIMEOUT ST (SAFETY) ×2 IMPLANT
ELECT REM PT RETURN 9FT ADLT (ELECTROSURGICAL)
ELECTRODE REM PT RTRN 9FT ADLT (ELECTROSURGICAL) IMPLANT
FIBER LASER FLEXIVA 365 (UROLOGICAL SUPPLIES) IMPLANT
FIBER LASER FLEXIVA 550 (UROLOGICAL SUPPLIES) IMPLANT
FIBER LASER TRAC TIP (UROLOGICAL SUPPLIES) ×2 IMPLANT
GLOVE BIO SURGEON STRL SZ8 (GLOVE) ×8 IMPLANT
GOWN STRL REUS W/ TWL LRG LVL3 (GOWN DISPOSABLE) ×1 IMPLANT
GOWN STRL REUS W/ TWL XL LVL3 (GOWN DISPOSABLE) ×1 IMPLANT
GOWN STRL REUS W/TWL LRG LVL3 (GOWN DISPOSABLE) ×1
GOWN STRL REUS W/TWL XL LVL3 (GOWN DISPOSABLE) ×1
GUIDEWIRE 0.038 PTFE COATED (WIRE) IMPLANT
GUIDEWIRE ANG ZIPWIRE 038X150 (WIRE) IMPLANT
GUIDEWIRE STR DUAL SENSOR (WIRE) ×2 IMPLANT
IV NS IRRIG 3000ML ARTHROMATIC (IV SOLUTION) IMPLANT
KIT BALLIN UROMAX 15FX10 (LABEL) IMPLANT
KIT BALLN UROMAX 15FX4 (MISCELLANEOUS) IMPLANT
KIT BALLN UROMAX 26 75X4 (MISCELLANEOUS)
MANIFOLD NEPTUNE II (INSTRUMENTS) ×2 IMPLANT
PACK CYSTO (CUSTOM PROCEDURE TRAY) ×2 IMPLANT
SET HIGH PRES BAL DIL (LABEL)
SHEATH ACCESS URETERAL 38CM (SHEATH) IMPLANT
WATER STERILE IRR 3000ML UROMA (IV SOLUTION) IMPLANT

## 2015-05-04 NOTE — Interval H&P Note (Signed)
History and Physical Interval Note:  05/04/2015 6:32 AM   Interval history: His stone broke up well and I thought I got all of the stone from within the kidney however he got readmitted for left lower quadrant pain and a follow-up CT revealed no perinephric fluid or hematoma and the stent was in good position  with no abnormality in the left lower quadrant where he was experiencing the pain. It was felt by one of my partners that it might have been from his stent but he had had a stent for about 3 months previously without any difficulty. Cultures were performed of both blood and urine and were found to be negative. The CT scan however showed a fragment still present in the lower pole of his right kidney.  I went over the KUB results with him today which is revealed a 7 mm fragment in the lower pole of his right kidney but everything else looked good. His urine was free of any sign of infection. We therefore discussed the ways that the remaining stone fragment could be managed. I told him I could remove the stent and the stone may pass on its own or it may just remaining in the lower pole of his right kidney but if he did pass he may develop renal colic and pain. The second option would be to perform lithotripsy on the stone although the stone seemed pretty hard and it is in the lower pole so I'm concerned that it may not be as effective as ureteroscopy so I discussed ureteroscopic management with him today. We went over the procedure in detail including its risks and palpitations and the fact that I may not have to leave the stent afterward. He has elected to proceed with this form of treatment to completely rid his right kidney of all stone.   He will be scheduled for right ureteroscopy with possible laser lithotripsy and extraction of all remaining stones in his right kidney.   Paras Kreider  has presented today for surgery, with the diagnosis of RIGHT RENAL CALCULUS  The various methods of treatment  have been discussed with the patient and family. After consideration of risks, benefits and other options for treatment, the patient has consented to  Procedure(s): RIGHT  URETEROSCOPY AND POSSIBLE STENT PLACEMENT (Right) POSSIBLE HOLMIUM LASER LITHOTRIPSY  (Right) as a surgical intervention .  The patient's history has been reviewed, patient examined, no change in status, stable for surgery.  I have reviewed the patient's chart and labs.  Questions were answered to the patient's satisfaction.     Garnett Farm

## 2015-05-04 NOTE — Anesthesia Preprocedure Evaluation (Signed)
Anesthesia Evaluation  Patient identified by MRN, date of birth, ID band Patient awake    Reviewed: Allergy & Precautions, NPO status , Patient's Chart, lab work & pertinent test results, reviewed documented beta blocker date and time   Airway Mallampati: II  TM Distance: >3 FB Neck ROM: Full    Dental no notable dental hx. (+) Partial Upper   Pulmonary sleep apnea , former smoker,    Pulmonary exam normal breath sounds clear to auscultation       Cardiovascular hypertension, Pt. on home beta blockers Normal cardiovascular exam Rhythm:Regular Rate:Normal     Neuro/Psych negative neurological ROS  negative psych ROS   GI/Hepatic negative GI ROS, Neg liver ROS,   Endo/Other  negative endocrine ROS  Renal/GU negative Renal ROS  negative genitourinary   Musculoskeletal negative musculoskeletal ROS (+)   Abdominal   Peds negative pediatric ROS (+)  Hematology negative hematology ROS (+)   Anesthesia Other Findings   Reproductive/Obstetrics negative OB ROS                             Lab Results  Component Value Date   WBC 7.3 04/23/2015   HGB 12.6* 04/23/2015   HCT 36.6* 04/23/2015   MCV 87.1 04/23/2015   PLT 182 04/23/2015   Lab Results  Component Value Date   CREATININE 1.24 04/23/2015   BUN 16 04/23/2015   NA 136 04/23/2015   K 3.3* 04/23/2015   CL 100* 04/23/2015   CO2 29 04/23/2015    Anesthesia Physical  Anesthesia Plan  ASA: II  Anesthesia Plan: General   Post-op Pain Management:    Induction: Intravenous  Airway Management Planned: LMA  Additional Equipment:   Intra-op Plan:   Post-operative Plan:   Informed Consent: I have reviewed the patients History and Physical, chart, labs and discussed the procedure including the risks, benefits and alternatives for the proposed anesthesia with the patient or authorized representative who has indicated his/her  understanding and acceptance.   Dental advisory given  Plan Discussed with: CRNA  Anesthesia Plan Comments:         Anesthesia Quick Evaluation

## 2015-05-04 NOTE — Anesthesia Procedure Notes (Addendum)
Procedure Name: LMA Insertion Date/Time: 05/04/2015 7:40 AM Performed by: Norva Pavlov Pre-anesthesia Checklist: Patient identified, Emergency Drugs available, Suction available and Patient being monitored Patient Re-evaluated:Patient Re-evaluated prior to inductionOxygen Delivery Method: Circle System Utilized Preoxygenation: Pre-oxygenation with 100% oxygen Intubation Type: IV induction Ventilation: Mask ventilation without difficulty LMA: LMA inserted LMA Size: 4.0 Number of attempts: 1 Airway Equipment and Method: bite block Placement Confirmation: positive ETCO2 Tube secured with: Tape Dental Injury: Teeth and Oropharynx as per pre-operative assessment    Performed by: Norva Pavlov

## 2015-05-04 NOTE — Discharge Instructions (Signed)
Post Bladder Surgery Instructions   General instructions:     Your recent bladder surgery requires very little post hospital care but some definite precautions.  Despite the fact that no skin incisions were used, the area around the bladder incisions are raw and covered with scabs to promote healing and prevent bleeding. Certain precautions are needed to insure that the scabs are not disturbed over the next 2-4 weeks while the healing proceeds.  Because the raw surface inside your bladder and the irritating effects of urine you may expect frequency of urination and/or urgency (a stronger desire to urinate) and perhaps even getting up at night more often. This will usually resolve or improve slowly over the healing period. You may see some blood in your urine over the first 6 weeks. Do not be alarmed, even if the urine was clear for a while. Get off your feet and drink lots of fluids until clearing occurs. If you start to pass clots or don't improve call us.  Catheter: (If you are discharged with a catheter.)  1. Keep your catheter secured to your leg at all times with tape or the supplied strap. 2. You may experience leakage of urine around your catheter- as long as the  catheter continues to drain, this is normal.  If your catheter stops draining  go to the ER. 3. You may also have blood in your urine, even after it has been clear for  several days; you may even pass some small blood clots or other material.  This  is normal as well.  If this happens, sit down and drink plenty of water to help  make urine to flush out your bladder.  If the blood in your urine becomes worse  after doing this, contact our office or return to the ER. 4. You may use the leg bag (small bag) during the day, but use the large bag at  night.  Diet:  You may return to your normal diet immediately. Because of the raw surface of your bladder, alcohol, spicy foods, foods high in acid and drinks with caffeine may  cause irritation or frequency and should be used in moderation. To keep your urine flowing freely and avoid constipation, drink plenty of fluids during the day (8-10 glasses). Tip: Avoid cranberry juice because it is very acidic.  Activity:  Your physical activity doesn't need to be restricted. However, if you are very active, you may see some blood in the urine. We suggest that you reduce your activity under the circumstances until the bleeding has stopped.  Bowels:  It is important to keep your bowels regular during the postoperative period. Straining with bowel movements can cause bleeding. A bowel movement every other day is reasonable. Use a mild laxative if needed, such as milk of magnesia 2-3 tablespoons, or 2 Dulcolax tablets. Call if you continue to have problems. If you had been taking narcotics for pain, before, during or after your surgery, you may be constipated. Take a laxative if necessary.    Medication:  You should resume your pre-surgery medications unless told not to. In addition you may be given an antibiotic to prevent or treat infection. Antibiotics are not always necessary. All medication should be taken as prescribed until the bottles are finished unless you are having an unusual reaction to one of the drugs.   Post Anesthesia Home Care Instructions  Activity: Get plenty of rest for the remainder of the day. A responsible adult should stay with you for   24 hours following the procedure.  For the next 24 hours, DO NOT: -Drive a car -Operate machinery -Drink alcoholic beverages -Take any medication unless instructed by your physician -Make any legal decisions or sign important papers.  Meals: Start with liquid foods such as gelatin or soup. Progress to regular foods as tolerated. Avoid greasy, spicy, heavy foods. If nausea and/or vomiting occur, drink only clear liquids until the nausea and/or vomiting subsides. Call your physician if vomiting continues.  Special  Instructions/Symptoms: Your throat may feel dry or sore from the anesthesia or the breathing tube placed in your throat during surgery. If this causes discomfort, gargle with warm salt water. The discomfort should disappear within 24 hours.  If you had a scopolamine patch placed behind your ear for the management of post- operative nausea and/or vomiting:  1. The medication in the patch is effective for 72 hours, after which it should be removed.  Wrap patch in a tissue and discard in the trash. Wash hands thoroughly with soap and water. 2. You may remove the patch earlier than 72 hours if you experience unpleasant side effects which may include dry mouth, dizziness or visual disturbances. 3. Avoid touching the patch. Wash your hands with soap and water after contact with the patch.    

## 2015-05-04 NOTE — Op Note (Signed)
PATIENT:  Eddie Miranda  PRE-OPERATIVE DIAGNOSIS: Right renal calculus  POST-OPERATIVE DIAGNOSIS: Same  PROCEDURE:  1. Removal of right double-J stent 2. Right ureteroscopy and laser lithotripsy  SURGEON: Garnett Farm, MD  INDICATION: Mr. Hoobler is a 54 year old male who had a large right renal stone treated with percutaneous nephrolithotomy. Postoperatively he was found to have a fragment appeared to be remaining in the lower pole of his right kidney. We discussed the risk of this stone causing obstruction with stent removal and the options for observation versus ureteroscopic management of the remaining fragment. He is elected to proceed with ureteroscopy.  ANESTHESIA:  General  EBL:  None  DRAINS: None  SPECIMEN:  None  DESCRIPTION OF PROCEDURE: The patient was taken to the major OR and placed on the table. General anesthesia was administered and then the patient was moved to the dorsal lithotomy position. The genitalia was sterilely prepped and draped. An official timeout was performed.  Initially the 21 French cystoscope with 30 lens was passed under direct vision. The bladder was then entered and fully inspected. It was noted be free of any tumors stones or inflammatory lesions. Ureteral orifices were of normal configuration and position. A stent was noted to be exiting the right ureteral orifice. It was grasped with alligator graspers and withdrawn through the meatus where a 0.038 inch floppy-tipped guidewire was then passed through the stent and up the right ureter under fluoroscopy to the area of the renal pelvis and left in place while the stent was removed.  Over the guidewire I passed the flexible, digital ureteroscope into the area the renal pelvis confirmed by fluoroscopy and then remove the guidewire. I then performed systematic inspection of the intrarenal collecting system starting in the upper pole and found 2 small fragments that appeared to be 1 mm in size. The  remaining calyces were inspected and no other stones were identified however in the lower pole calyx where I had previously gained access for his percutaneous procedure I could see what appeared to be a stone that was now embedded in the parenchyma. It appears the stone was located where the previous nephrostomy site had been. I used a 200  holmium fiber to perform a laser lithotripsy and was able to fragment the stone slightly but it was obviously embedded within the parenchyma and I felt that further attempts at fragmentation would likely result in bleeding and because the stone was embedded with no risk of passage I didn't feel further attempts at fragmentation should be undertaken and left this in place.  I removed the ureteroscope by withdrawing it under direct vision down the ureter and no stones or other abnormalities were noted throughout the length of the ureter. I then removed the ureteroscope, inserted the cystoscope sheath with obturator and drained the bladder and the patient was awakened and taken to recovery room in stable and satisfactory condition. He tolerated the procedure well no intraoperative complications.   PLAN OF CARE: Discharge to home after PACU  PATIENT DISPOSITION:  PACU - hemodynamically stable.

## 2015-05-04 NOTE — Anesthesia Postprocedure Evaluation (Signed)
  Anesthesia Post-op Note  Patient: Eddie Miranda  Procedure(s) Performed: Procedure(s): CYSTO, RIGHT  URETEROSCOPY AND STENT REMOVAL (Right)  HOLMIUM LASER LITHOTRIPSY  (Right)  Patient Location: PACU  Anesthesia Type:General  Level of Consciousness: awake and alert   Airway and Oxygen Therapy: Patient Spontanous Breathing  Post-op Pain: none  Post-op Assessment: Post-op Vital signs reviewed              Post-op Vital Signs: Reviewed  Last Vitals:  Filed Vitals:   05/04/15 0915  BP: 145/85  Pulse: 78  Temp:   Resp: 18    Complications: No apparent anesthesia complications

## 2015-05-04 NOTE — H&P (View-Only) (Signed)
Eddie Miranda is a 54 year old male with a large right renal calculus.   History of Present Illness He was working in [place] and developed sepsis secondary to an obstructing right ureteral stone. He underwent stent placement. He's been having pain and intermittent gross hematuria. 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  His urinalysis on 03/16/15 had numerous white cells but he just finished a course of antibiotics. A urine culture was sent.  He has been having a lot of irritative voiding symptoms including frequency, urgency, nocturia straining to urinate and has been doing this for 3 months because he was in [place] and did not have insurance there. He is anxious to get his stone treated. He has intermittently seen gross hematuria.   Past Medical History Problems  1. History of Anxiety (F41.9) 2. History of depression (Z86.59) 3. History of hypertension (Z86.79) 4. History of sleep apnea (Z87.09) 5. History of urinary stone (Z61.096)  Surgical History Problems  1. History of No Surgical Problems  Current Meds 1. Atrovent HFA 17 MCG/ACT Inhalation Aerosol Solution;  Therapy: (Recorded:01Aug2016) to Recorded 2. Coreg 6.25 MG Oral Tablet;  Therapy: (Recorded:01Aug2016) to Recorded 3. Hydrochlorothiazide 12.5 MG Oral Capsule;  Therapy: (Recorded:01Aug2016) to Recorded 4. Lisinopril 10 MG Oral Tablet;  Therapy: (Recorded:01Aug2016) to Recorded 5. Methocarbamol 750 MG Oral Tablet;  Therapy: (Recorded:01Aug2016) to Recorded 6. Motrin IB 200 MG Oral Tablet;  Therapy: (Recorded:01Aug2016) to Recorded 7. Tamsulosin HCl - 0.4 MG Oral Capsule;  Therapy: (Recorded:01Aug2016) to Recorded 8. TraMADol HCl - 50 MG Oral Tablet;  Therapy: (Recorded:01Aug2016) to Recorded  Allergies Medication  1. Penicillins  Family History Problems  1. Family history of arthritis (Z82.61) : Mother 2. Family history of diabetes mellitus (Z83.3) : Mother 3. Family history of liver cancer (Z80.0) :  Sister 4. Family history of Throat cancer : Father  Social History Problems    Denied: History of Alcohol use   Former smoker [phone])   Married   Occupation  Review of Systems Genitourinary, constitutional, skin, eye, otolaryngeal, hematologic/lymphatic, cardiovascular, pulmonary, endocrine, musculoskeletal, gastrointestinal, neurological and psychiatric system(s) were reviewed and pertinent findings if present are noted and are otherwise negative.  Genitourinary: urinary frequency, feelings of urinary urgency, dysuria, nocturia, difficulty starting the urinary stream, hematuria and initiating urination requires straining.  Gastrointestinal: constipation.  Respiratory: shortness of breath.  Musculoskeletal: back pain.    Vitals Vital Signs  Blood Pressure: 150 / 87 Heart Rate: 71 Recorded: 01Aug2016 10:04AM  Height: 5 ft 6.5 in Weight: 210 lb  BMI Calculated: 33.39 BSA Calculated: 2.05  Physical Exam Constitutional: Well nourished and well developed . No acute distress.   ENT:. The ears and nose are normal in appearance.   Neck: The appearance of the neck is normal and no neck mass is present.   Pulmonary: No respiratory distress and normal respiratory rhythm and effort.   Cardiovascular: Heart rate and rhythm are normal . No peripheral edema.   Abdomen: The abdomen is soft and nontender. No masses are palpated. No CVA tenderness. No hernias are palpable. No hepatosplenomegaly noted.   Lymphatics: The femoral and inguinal nodes are not enlarged or tender.   Skin: Normal skin turgor, no visible rash and no visible skin lesions.   Neuro/Psych:. Mood and affect are appropriate.    Results/Data Urine  COLOR AMBER  APPEARANCE CLOUDY  SPECIFIC GRAVITY 1.025  pH 5.5  GLUCOSE NEGATIVE  BILIRUBIN NEGATIVE  KETONE NEGATIVE  BLOOD 3+  PROTEIN 2+  NITRITE NEGATIVE  LEUKOCYTE  ESTERASE 1+  SQUAMOUS EPITHELIAL/HPF 0-5 HPF WBC 0-5 WBC/HPF RBC >60  RBC/HPF BACTERIA NONE SEEN HPF CRYSTALS NONE SEEN HPF CASTS NONE SEEN LPF Yeast NONE SEEN HPF  Old records or history reviewed: Notes from Dr. Parke Simmers as above.  The following images/tracing/specimen were independently visualized:  KUB as below.  The following clinical lab reports were reviewed:  UA: Urine had red cells but did not appear infected.  Will request old records/history: Results of latest urine culture from Dr. Tedra [place] office.    Assessment   On his KUB today he has a 3.2 x 2.0 cm stone in the lower pole of his right kidney. I don't see any stones along the course of his ureter. The stent is in place and in good position. I didn't see a CT scan in 4/13 that revealed a small stone in the right kidney that had Hounsfield units of 1200.      We discussed the management of urinary stones. These options include observation, ureteroscopy, shockwave lithotripsy, and PCNL. We discussed which options are relevant to these particular stones. We discussed the natural history of stones as well as the complications of untreated stones and the impact on quality of life without treatment as well as with each of the above listed treatments. We also discussed the efficacy of each treatment in its ability to clear the stone burden. With any of these management options I discussed the signs and symptoms of infection and the need for emergent treatment should these be experienced. For each option we discussed the ability of each procedure to clear the patient of their stone burden.    For observation I described the risks which include but are not limited to silent renal damage, life-threatening infection, need for emergent surgery, failure to pass stone, and pain.    For ureteroscopy I described the risks which include heart attack, stroke, pulmonary embolus, death, bleeding, infection, damage to contiguous structures, positioning injury, ureteral stricture, ureteral avulsion, ureteral injury, need  for ureteral stent, inability to perform ureteroscopy, need for an interval procedure, inability to clear stone burden, stent discomfort and pain.    For shockwave lithotripsy I described the risks which include arrhythmia, kidney contusion, kidney hemorrhage, need for transfusion, long-term risk of diabetes or hypertension, back discomfort, flank ecchymosis, flank abrasion, inability to break up stone, inability to pass stone fragments, Steinstrasse, infection associated with obstructing stones, need for different surgical procedure and the need for repeat shockwave lithotripsy.    For PCNL I described the risks including heart attack, sure, pulmonary embolus, death, positioning injury, pneumothorax, hydrothorax, need for chest tube, inability to clear stone burden, renal laceration, arterial venous fistula or malformation, need for embolization of kidney, loss of kidney or renal function, need for repeat procedure, need for prolonged nephrostomy tube, ureteral avulsion and fistula.    Due to the size of the stone we discussed the fact that he would not be a good candidate for lithotripsy or ureteroscopy and I have therefore recommended PCNL.   Plan   He will be scheduled for a right percutaneous nephrolithotomy.

## 2015-05-04 NOTE — Transfer of Care (Signed)
Last Vitals:  Filed Vitals:   05/04/15 0712  BP: 165/88  Pulse: 80  Temp: 36.9 C  Resp: 18    Immediate Anesthesia Transfer of Care Note  Patient: Eddie Miranda  Procedure(s) Performed: Procedure(s) (LRB): CYSTO, RIGHT  URETEROSCOPY AND STENT REMOVAL (Right)  HOLMIUM LASER LITHOTRIPSY  (Right)  Patient Location: PACU  Anesthesia Type: General  Level of Consciousness: awake, alert  and oriented  Airway & Oxygen Therapy: Patient Spontanous Breathing and Patient connected to face mask oxygen  Post-op Assessment: Report given to PACU RN and Post -op Vital signs reviewed and stable  Post vital signs: Reviewed and stable  Complications: No apparent anesthesia complications

## 2015-05-07 ENCOUNTER — Encounter (HOSPITAL_BASED_OUTPATIENT_CLINIC_OR_DEPARTMENT_OTHER): Payer: Self-pay | Admitting: Urology

## 2015-11-04 IMAGING — US US RENAL
1 series · 14 of 25 positions shown · non-contrast
Comparison: CT of the abdomen and pelvis performed 12/16/2011

CLINICAL DATA: Acute onset of right groin pain. Dysuria and
hematuria. Initial encounter.

EXAM:
RENAL / URINARY TRACT ULTRASOUND COMPLETE

[Series 1: us renal · 0.27mm/px · 14 of 37 slices shown]
[im 1/37]
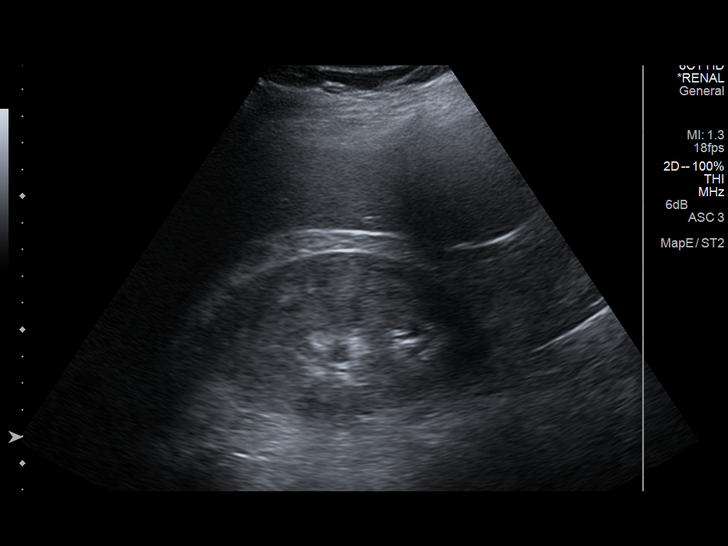
[im 4/37]
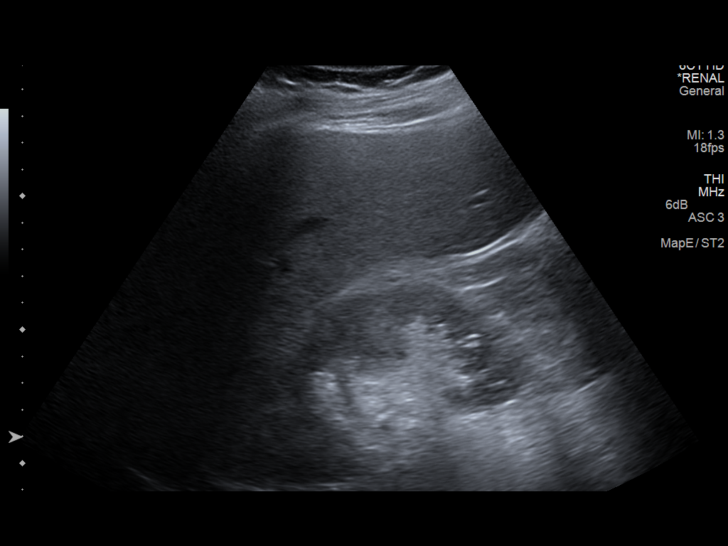
[im 7/37]
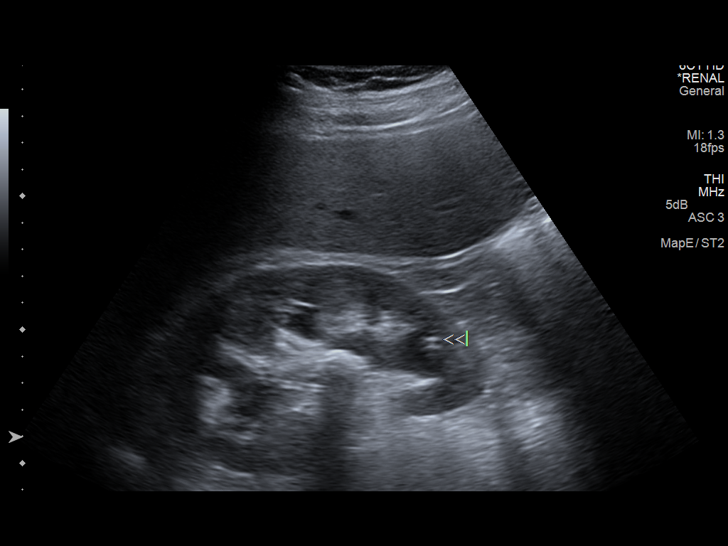
[im 10/37]
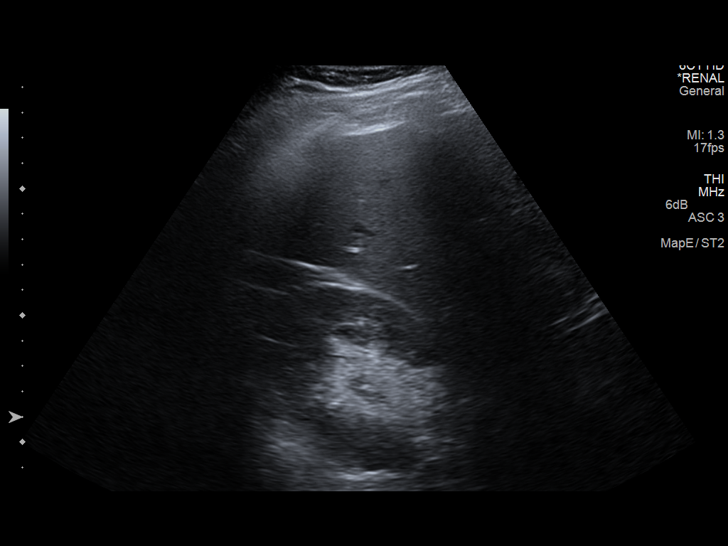
[im 13/37]
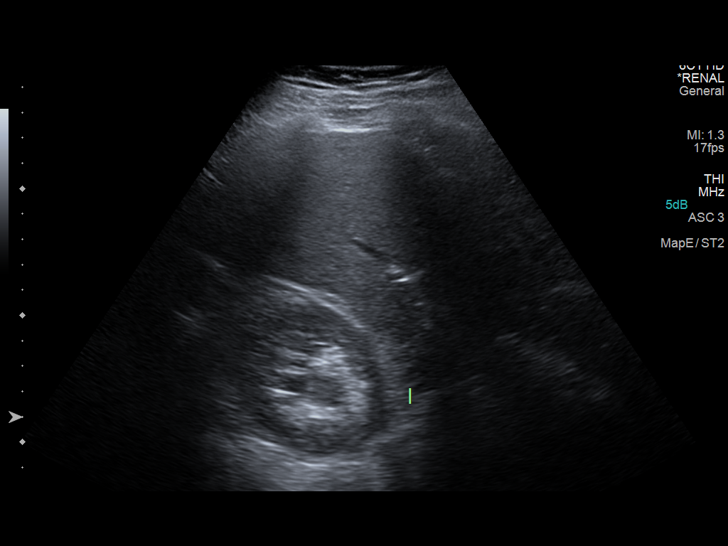
[im 14/37]
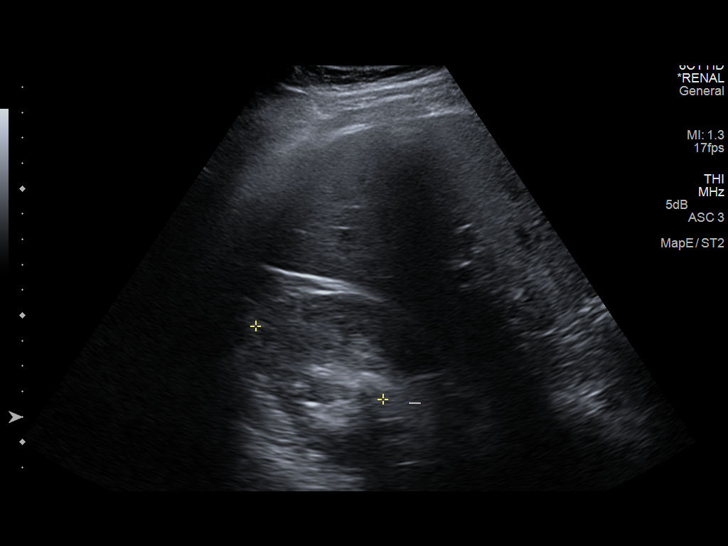
[im 17/37]
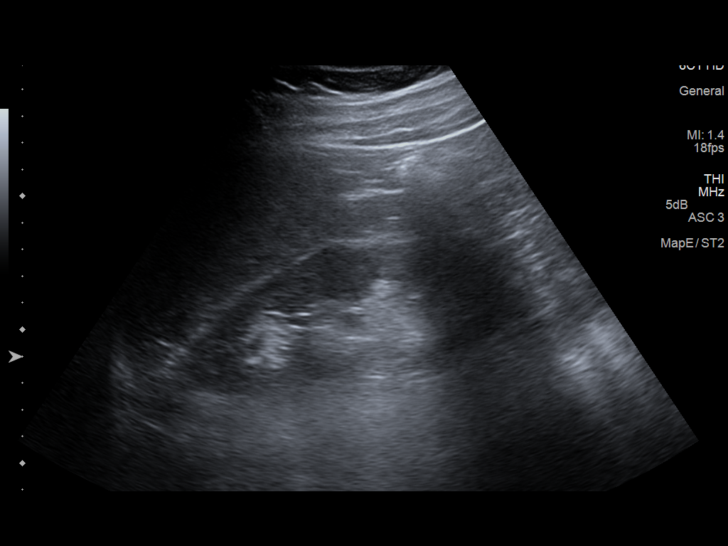
[im 20/37]
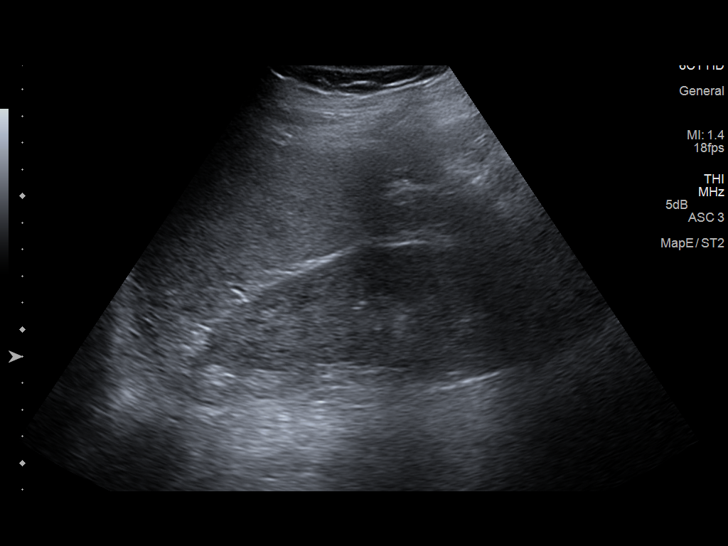
[im 23/37]
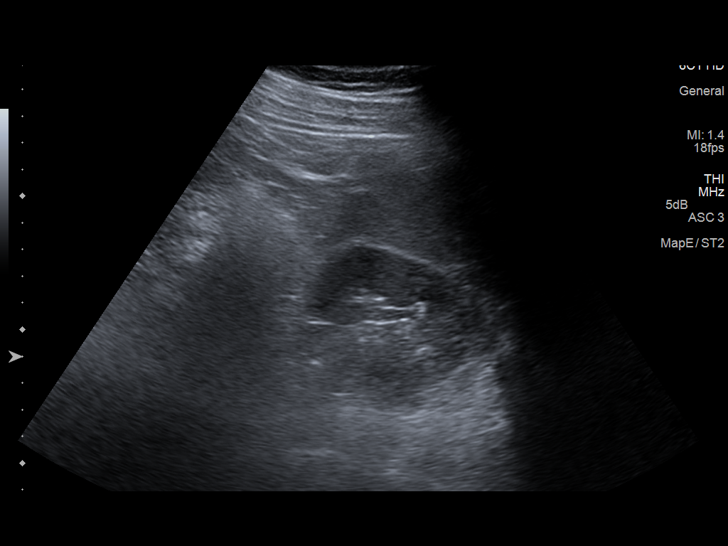
[im 25/37]
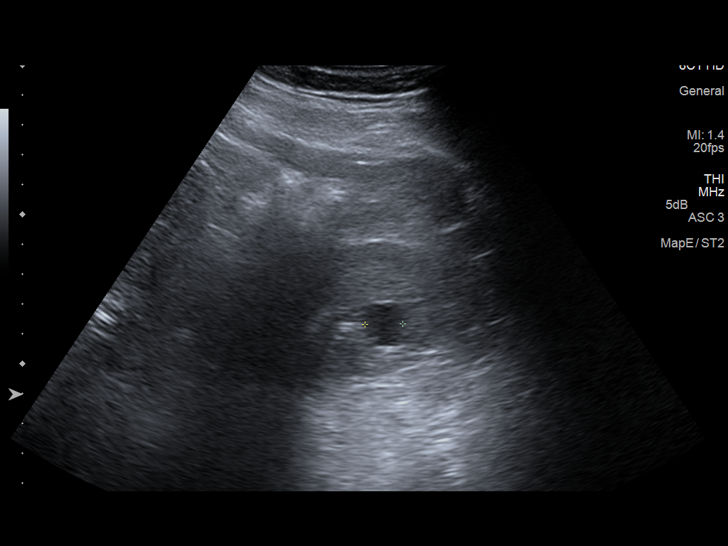
[im 28/37]
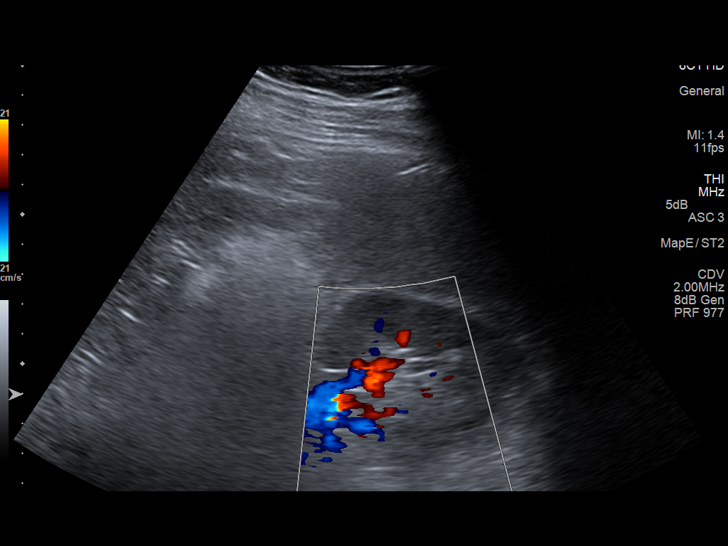
[im 31/37]
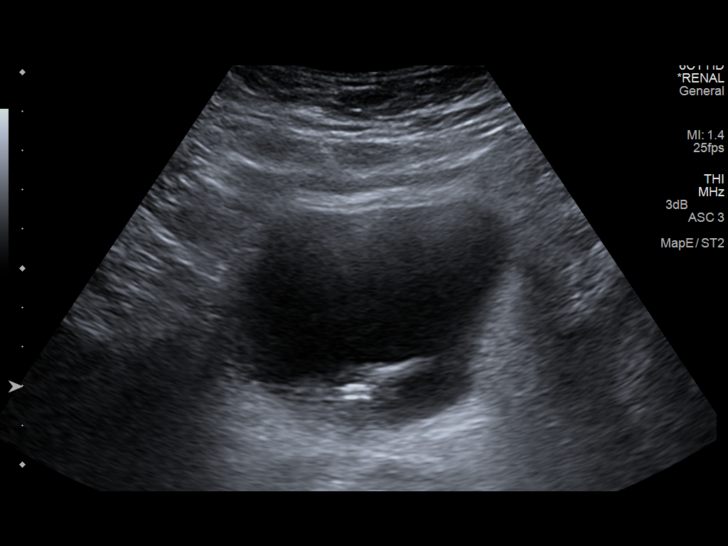
[im 34/37]
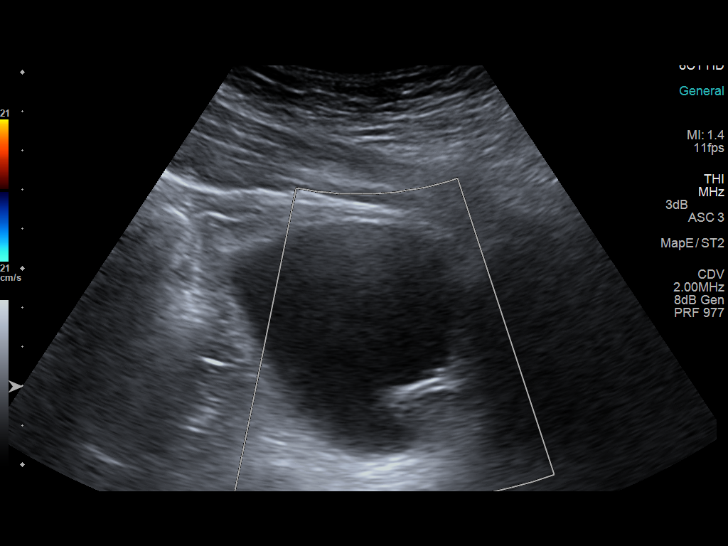
[im 37/37]
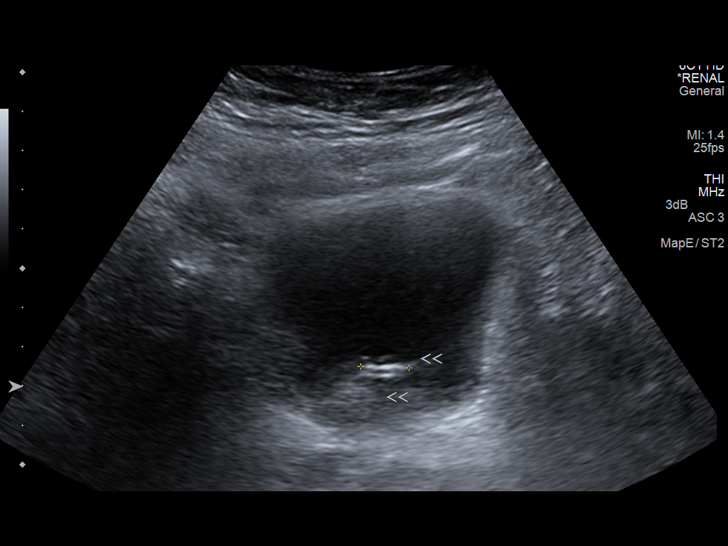

[14 of 25 positions shown; findings below may reference images not displayed]

FINDINGS: Right Kidney:

Length: 11.8 cm. Echogenicity within normal limits. A 1.3 cm stone
is noted at the lower pole of the right kidney. No mass or
hydronephrosis visualized.

Left Kidney:

Length: 12.7 cm. Echogenicity within normal limits. A 1.7 cm cyst is
noted at the the lower pole of the left kidney. No hydronephrosis
visualized.

Bladder:

Appears normal for degree of bladder distention. Several stones and
apparent debris are noted at the base of the bladder.
IMPRESSION: 1. No evidence of hydronephrosis.
2. Several stones and apparent debris noted at the base of the
bladder.
3. Small left renal cyst noted.
4. 1.3 cm nonobstructing stone at the lower pole of the right
kidney.

## 2017-06-27 IMAGING — US IR URETURAL STENT RIGHT NEW ACCESS W/O SEP NEPHROSTOMY CATH
1 series · 1 of 1 positions shown · non-contrast
Comparison: none

CLINICAL DATA: Right renal calculus, access for nephrolithotomy
later today

[Series 1: ir (id) (id)/(id)/(id) ir · 1 of 1 slices shown]
[im 1/1]
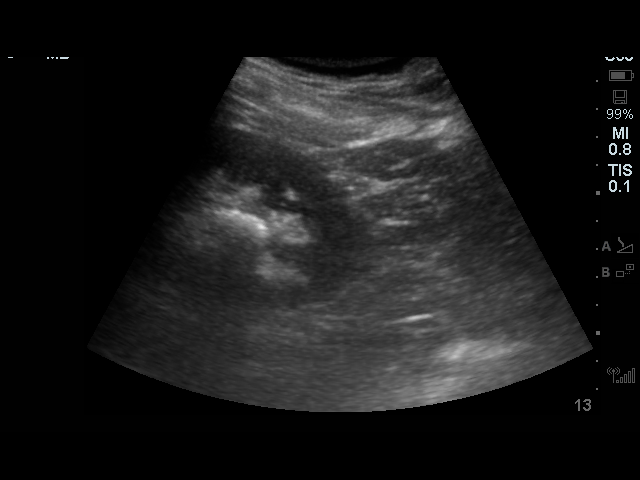

[1 of 1 positions shown; findings below may reference images not displayed]

EXAM:
ULTRASOUND FLUOROSCOPIC RIGHT NEPHRO URETERAL CATHETER ACCESS

Radiologist:  Tidemand, Geir Arne

Guidance:  Ultrasound and fluoroscopic

FLUOROSCOPY TIME:  3 minutes 12 seconds, 92 mGy

MEDICATIONS AND MEDICAL HISTORY:
4 mg Cipro administered within 1 hour of the procedure, 2 mg Versed,
75 mcg fentanyl

ANESTHESIA/SEDATION:
15 minutes

CONTRAST:  10mL OMNIPAQUE IOHEXOL 300 MG/ML  SOLN

COMPLICATIONS:
None immediate

PROCEDURE:
Informed consent was obtained from the patient following explanation
of the procedure, risks, benefits and alternatives. The patient
understands, agrees and consents for the procedure. All questions
were addressed. A time out was performed.

Maximal barrier sterile technique utilized including caps, mask,
sterile gowns, sterile gloves, large sterile drape, hand hygiene,
and Betadine.

Previous imaging reviewed. Patient positioned prone. Preliminary
ultrasound performed. Mild hydronephrosis noted. Echogenic shadowing
renal pelvis calculus evident.

Under sterile conditions and local anesthesia, an 18 gauge
percutaneous needle was advanced into a posterior lower pole calyx.
Needle position confirmed with ultrasound. The stone was felt on the
needle tip. Position confirmed with fluoroscopy. There was return of
blood-tinged urine. 018 guidewire advanced followed by the Accustick
dilator set. Contrast injection confirms intrarenal position. Images
obtained for documentation. Bentson guidewire inserted. A 5 French
MPA catheter was advanced to access the ureter. Access was advanced
down the ureter into the bladder. Four French glide cath remains
within the bladder to serve as nephro ureteral access for
nephrolithotomy later today. Ureteral stent remains. Access secured
with a Prolene suture and sterile dressing applied. No immediate
complication. Patient tolerated the procedure well.
IMPRESSION: Successful ultrasound and fluoroscopic right nephro ureteral
catheter access prior to operative nephrolithotomy

## 2017-06-29 IMAGING — CT CT RENAL STONE PROTOCOL
2 of 3 series · 16 of 45 positions shown, 18 images · non-contrast
Comparison: 12/16/2011.

CLINICAL DATA: Right flank pain. Percutaneous nephrolithotomy
yesterday.

EXAM:
CT ABDOMEN AND PELVIS WITHOUT CONTRAST
TECHNIQUE: Multidetector CT imaging of the abdomen and pelvis was performed
following the standard protocol without IV contrast.

[Series 4: lung · axial · 0.81mm/px · z∈[-163,-33]mm · 13 of 31 slices shown, 15 images]
[im 3/31  soft-tissue]
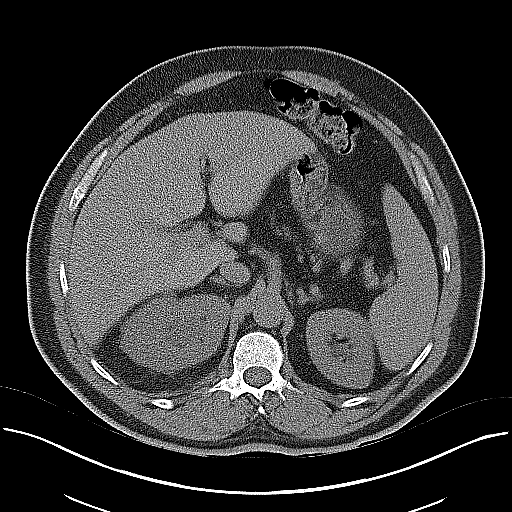
[im 3/31  bone]
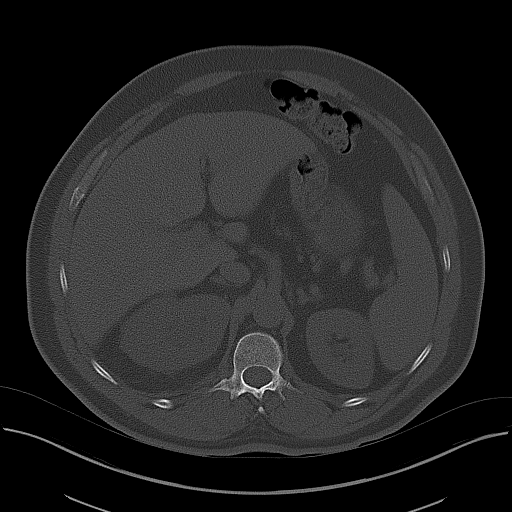
[im 5/31  soft-tissue]
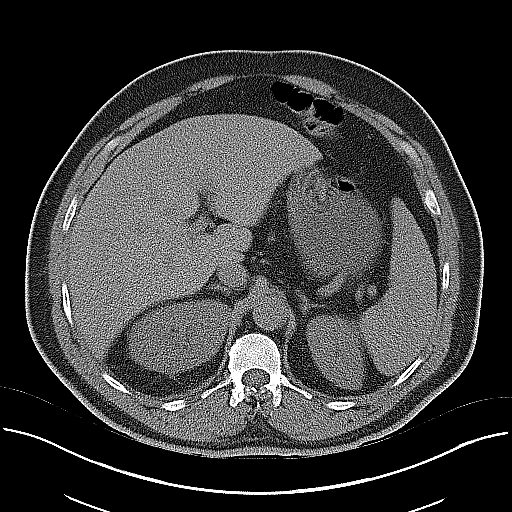
[im 7/31  soft-tissue]
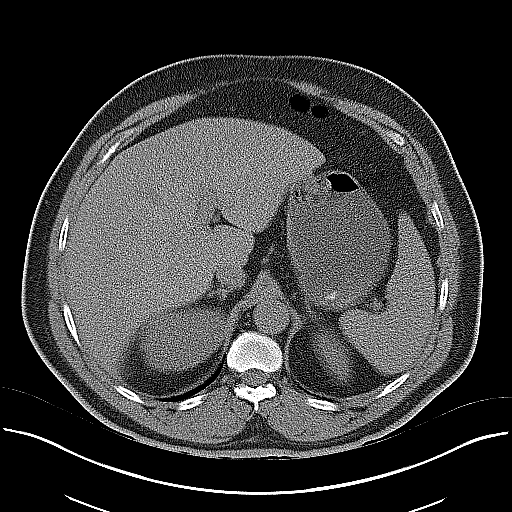
[im 9/31  soft-tissue]
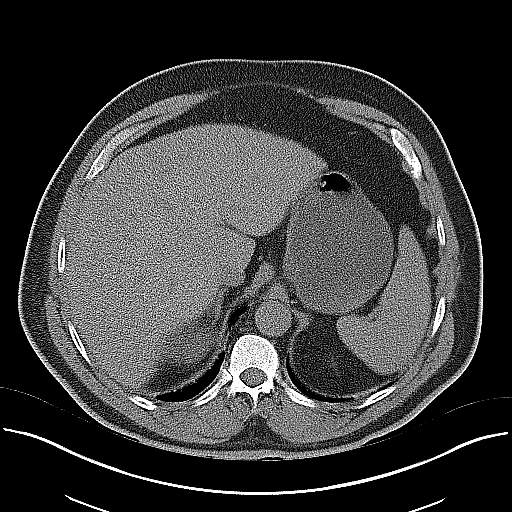
[im 12/31  soft-tissue]
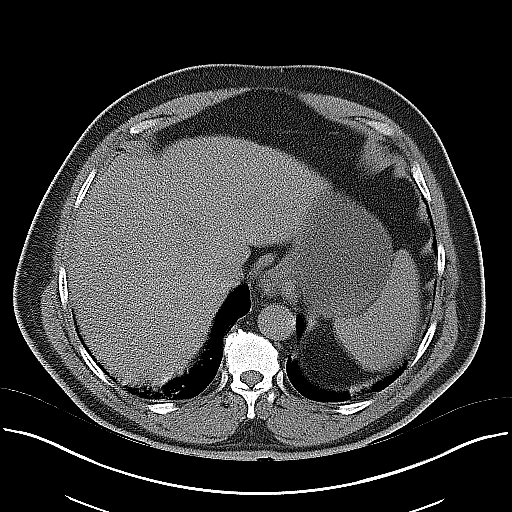
[im 14/31  soft-tissue]
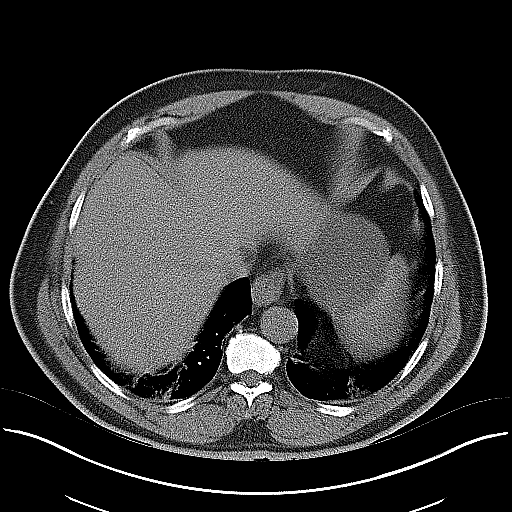
[im 16/31  soft-tissue]
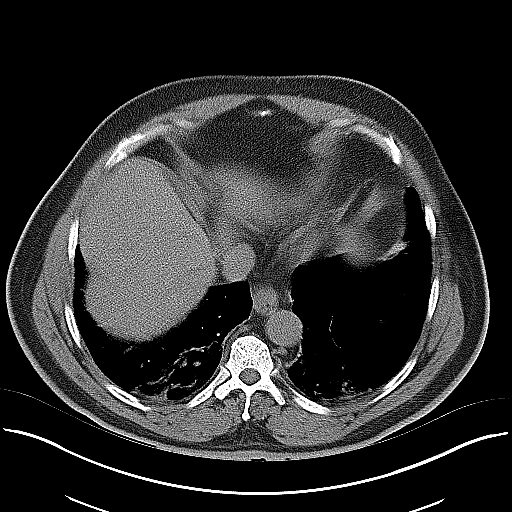
[im 18/31  soft-tissue]
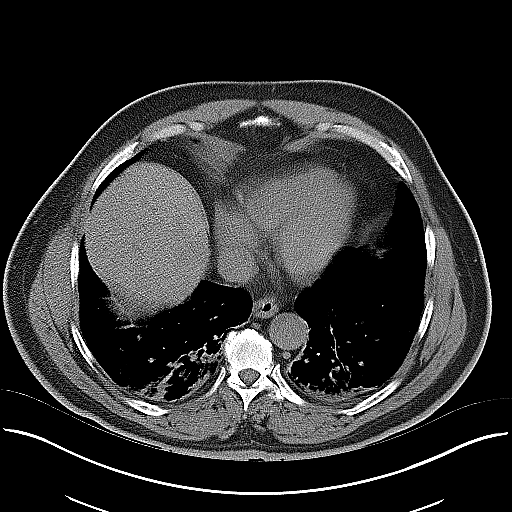
[im 20/31  soft-tissue]
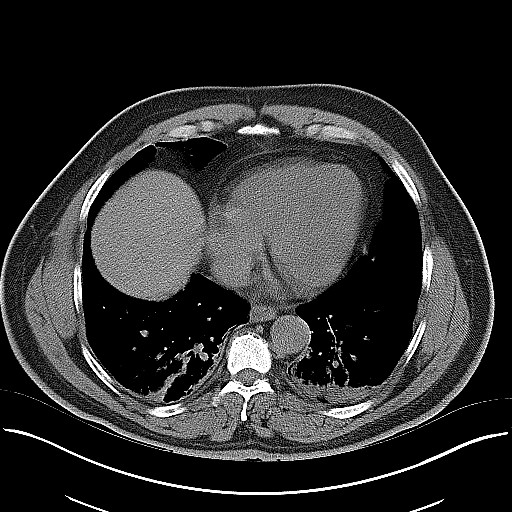
[im 20/31  bone]
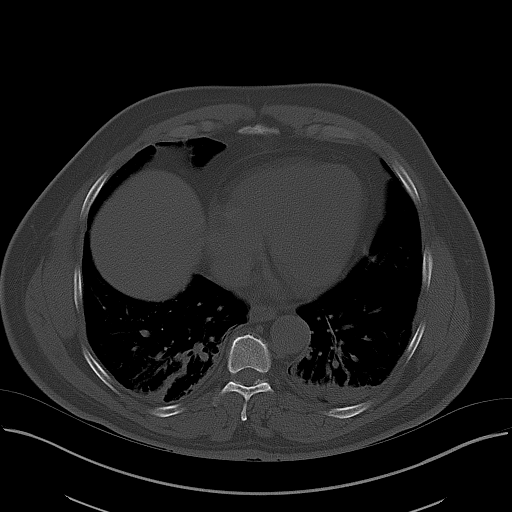
[im 23/31  soft-tissue]
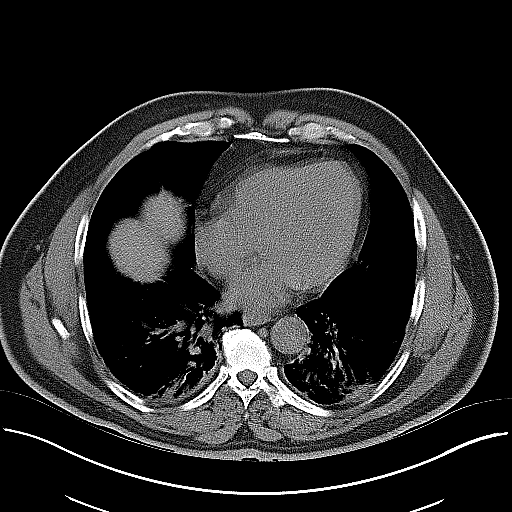
[im 25/31  soft-tissue]
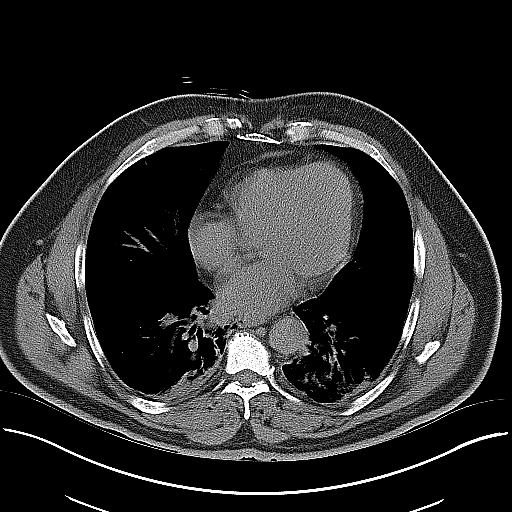
[im 27/31  soft-tissue]
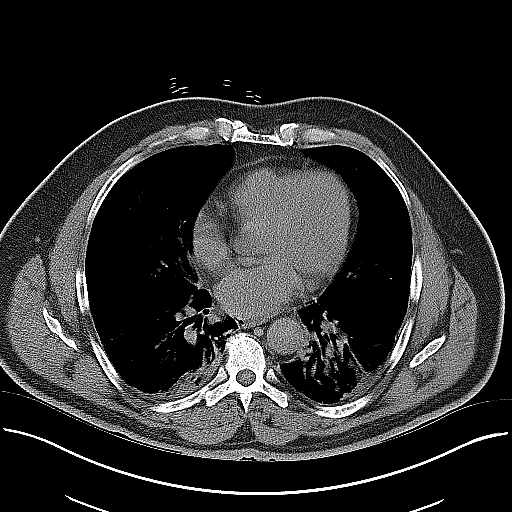
[im 29/31  soft-tissue]
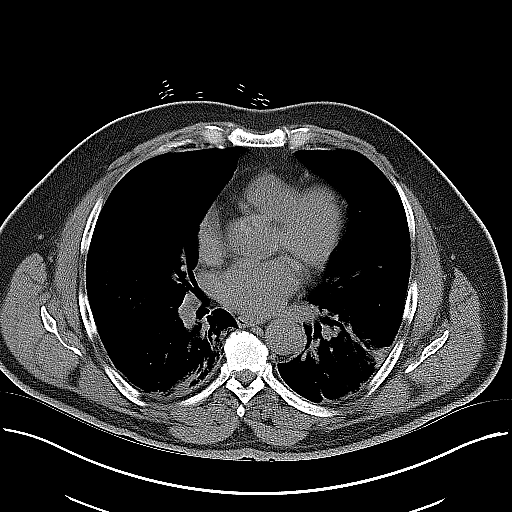

[Series 5: coronal · coronal · 0.78mm/px · 3 of 112 slices shown]
[im 38/112  soft-tissue]
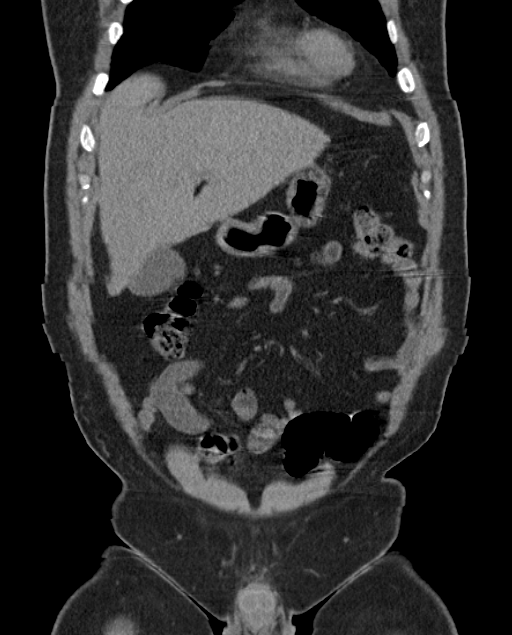
[im 50/112  soft-tissue]
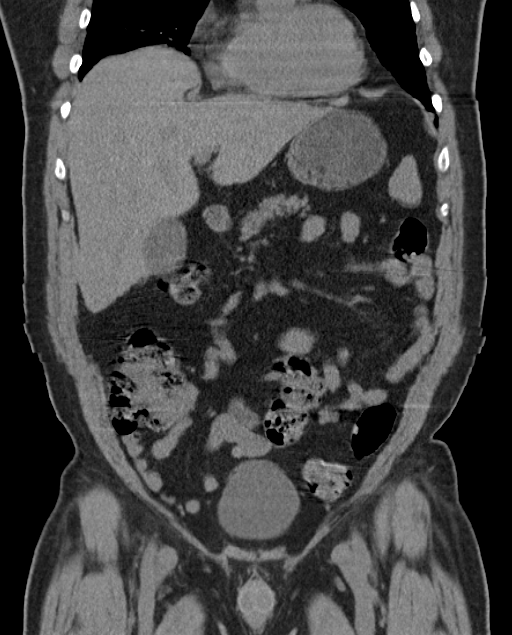
[im 62/112  soft-tissue]
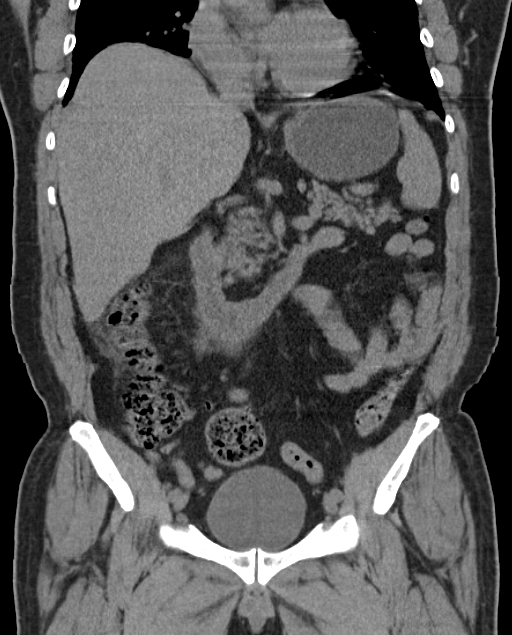

[16 of 45 positions shown; findings below may reference images not displayed]

FINDINGS: There is a satisfactorily positioned right ureteral stent. There are
3 lower pole right collecting system calculus fragments, the largest
measuring 5 x 7 mm. The others measure 2-4 mm. Moderate
hydronephrosis is present on the right. There is moderate
perinephric stranding. There is no focal parenchymal abnormality
about either kidney. There is no perinephric hematoma or drainable
fluid collection.

There are unremarkable unenhanced appearances of the liver,
gallbladder, pancreas, spleen, adrenals. There is a 2 mm upper pole
left collecting system calculus.

The abdominal aorta is normal in caliber. There is mild
atherosclerotic calcification. There is no adenopathy in the abdomen
or pelvis.

There are normal appearances of the stomach, small bowel and colon.
The appendix is normal.

There is no significant musculoskeletal lesion.
IMPRESSION: 1. Satisfactorily positioned right ureteral stent.
2. Lower pole right collecting system calculi measuring up to 5 x 7
mm. Upper pole left nephrolithiasis.
3. Moderate right hydronephrosis and perinephric stranding
opacities. No perinephric hematoma or other significant
complication.
